# Patient Record
Sex: Female | Born: 1960
Health system: Southern US, Community
[De-identification: ages and names within clinical notes are randomized; demographics above are authoritative.]

## PROBLEM LIST (undated history)

## (undated) DIAGNOSIS — I10 Essential (primary) hypertension: Secondary | ICD-10-CM

## (undated) HISTORY — PX: TUBAL LIGATION: SHX77

## (undated) HISTORY — DX: Essential (primary) hypertension: I10

---

## 1998-09-29 ENCOUNTER — Emergency Department (HOSPITAL_COMMUNITY): Admission: EM | Admit: 1998-09-29 | Discharge: 1998-09-29 | Payer: Self-pay | Admitting: Emergency Medicine

## 1998-09-29 ENCOUNTER — Encounter: Payer: Self-pay | Admitting: Emergency Medicine

## 2001-03-26 ENCOUNTER — Emergency Department (HOSPITAL_COMMUNITY): Admission: EM | Admit: 2001-03-26 | Discharge: 2001-03-26 | Payer: Self-pay | Admitting: Emergency Medicine

## 2001-03-26 ENCOUNTER — Encounter: Payer: Self-pay | Admitting: Emergency Medicine

## 2002-05-19 ENCOUNTER — Emergency Department (HOSPITAL_COMMUNITY): Admission: EM | Admit: 2002-05-19 | Discharge: 2002-05-20 | Payer: Self-pay | Admitting: Emergency Medicine

## 2011-03-07 ENCOUNTER — Emergency Department (HOSPITAL_COMMUNITY): Payer: BC Managed Care – PPO

## 2011-03-07 ENCOUNTER — Emergency Department (HOSPITAL_COMMUNITY)
Admission: EM | Admit: 2011-03-07 | Discharge: 2011-03-07 | Disposition: A | Discharge status: Home / Self Care | Payer: BC Managed Care – PPO | Attending: Emergency Medicine | Admitting: Emergency Medicine

## 2011-03-07 DIAGNOSIS — R110 Nausea: Secondary | ICD-10-CM | POA: Insufficient documentation

## 2011-03-07 DIAGNOSIS — H538 Other visual disturbances: Secondary | ICD-10-CM | POA: Insufficient documentation

## 2011-03-07 DIAGNOSIS — R51 Headache: Secondary | ICD-10-CM | POA: Insufficient documentation

## 2011-03-07 DIAGNOSIS — R141 Gas pain: Secondary | ICD-10-CM | POA: Insufficient documentation

## 2011-03-07 DIAGNOSIS — R109 Unspecified abdominal pain: Secondary | ICD-10-CM | POA: Insufficient documentation

## 2011-03-07 DIAGNOSIS — K8020 Calculus of gallbladder without cholecystitis without obstruction: Secondary | ICD-10-CM | POA: Insufficient documentation

## 2011-03-07 DIAGNOSIS — R42 Dizziness and giddiness: Secondary | ICD-10-CM | POA: Insufficient documentation

## 2011-03-07 DIAGNOSIS — R142 Eructation: Secondary | ICD-10-CM | POA: Insufficient documentation

## 2011-03-07 LAB — URINALYSIS, ROUTINE W REFLEX MICROSCOPIC
Bilirubin Urine: NEGATIVE
Glucose, UA: NEGATIVE mg/dL
Hgb urine dipstick: NEGATIVE
Ketones, ur: NEGATIVE mg/dL
Leukocytes, UA: NEGATIVE
Nitrite: POSITIVE — AB
Protein, ur: NEGATIVE mg/dL
Specific Gravity, Urine: 1.017 (ref 1.005–1.030)
Urobilinogen, UA: 0.2 mg/dL (ref 0.0–1.0)
pH: 7 (ref 5.0–8.0)

## 2011-03-07 LAB — COMPREHENSIVE METABOLIC PANEL
ALT: 16 U/L (ref 0–35)
AST: 23 U/L (ref 0–37)
Albumin: 3.5 g/dL (ref 3.5–5.2)
Alkaline Phosphatase: 97 U/L (ref 39–117)
BUN: 10 mg/dL (ref 6–23)
CO2: 23 mEq/L (ref 19–32)
Calcium: 9.2 mg/dL (ref 8.4–10.5)
Chloride: 108 mEq/L (ref 96–112)
Creatinine, Ser: 0.82 mg/dL (ref 0.4–1.2)
GFR calc Af Amer: >60 mL/min (ref >60)
GFR calc non Af Amer: >60 mL/min (ref >60)
Glucose, Bld: 83 mg/dL (ref 70–99)
Potassium: 3.9 mEq/L (ref 3.5–5.1)
Sodium: 139 mEq/L (ref 135–145)
Total Bilirubin: 0.4 mg/dL (ref 0.3–1.2)
Total Protein: 6.8 g/dL (ref 6.0–8.3)

## 2011-03-07 LAB — DIFFERENTIAL
Basophils Absolute: 0 10*3/uL (ref 0.0–0.1)
Basophils Relative: 0 % (ref 0–1)
Eosinophils Absolute: 0.1 10*3/uL (ref 0.0–0.7)
Eosinophils Relative: 1 % (ref 0–5)
Lymphocytes Relative: 45 % (ref 12–46)
Lymphs Abs: 1.9 10*3/uL (ref 0.7–4.0)
Monocytes Absolute: 0.3 10*3/uL (ref 0.1–1.0)
Monocytes Relative: 8 % (ref 3–12)
Neutro Abs: 1.9 10*3/uL (ref 1.7–7.7)
Neutrophils Relative %: 46 % (ref 43–77)

## 2011-03-07 LAB — URINE MICROSCOPIC-ADD ON

## 2011-03-07 LAB — CBC
HCT: 37.9 % (ref 36.0–46.0)
Hemoglobin: 13 g/dL (ref 12.0–15.0)
MCH: 29.2 pg (ref 26.0–34.0)
MCHC: 34.3 g/dL (ref 30.0–36.0)
MCV: 85.2 fL (ref 78.0–100.0)
Platelets: 230 10*3/uL (ref 150–400)
RBC: 4.45 MIL/uL (ref 3.87–5.11)
RDW: 14.2 % (ref 11.5–15.5)
WBC: 4.2 10*3/uL (ref 4.0–10.5)

## 2011-03-07 LAB — LIPASE, BLOOD: Lipase: 23 U/L (ref 11–59)

## 2011-03-09 LAB — URINE CULTURE
Colony Count: >=100000
Culture  Setup Time: 201204241517

## 2011-04-03 ENCOUNTER — Encounter: Payer: Self-pay | Admitting: Internal Medicine

## 2011-04-06 ENCOUNTER — Ambulatory Visit: Payer: BC Managed Care – PPO | Admitting: Internal Medicine

## 2011-04-25 ENCOUNTER — Ambulatory Visit: Payer: BC Managed Care – PPO | Admitting: Internal Medicine

## 2011-04-25 DIAGNOSIS — Z0289 Encounter for other administrative examinations: Secondary | ICD-10-CM

## 2014-03-13 ENCOUNTER — Institutional Professional Consult (permissible substitution): Payer: BC Managed Care – PPO | Admitting: Internal Medicine

## 2014-10-17 ENCOUNTER — Emergency Department (HOSPITAL_COMMUNITY)
Admission: EM | Admit: 2014-10-17 | Discharge: 2014-10-17 | Disposition: A | Discharge status: Home / Self Care | Payer: BC Managed Care – PPO | Attending: Emergency Medicine | Admitting: Emergency Medicine

## 2014-10-17 ENCOUNTER — Encounter (HOSPITAL_COMMUNITY): Payer: Self-pay | Admitting: *Deleted

## 2014-10-17 DIAGNOSIS — Z79899 Other long term (current) drug therapy: Secondary | ICD-10-CM | POA: Insufficient documentation

## 2014-10-17 DIAGNOSIS — N6452 Nipple discharge: Secondary | ICD-10-CM | POA: Insufficient documentation

## 2014-10-17 DIAGNOSIS — Z3202 Encounter for pregnancy test, result negative: Secondary | ICD-10-CM | POA: Diagnosis not present

## 2014-10-17 LAB — URINALYSIS, ROUTINE W REFLEX MICROSCOPIC
Bilirubin Urine: NEGATIVE
Glucose, UA: NEGATIVE mg/dL
Hgb urine dipstick: NEGATIVE
Ketones, ur: NEGATIVE mg/dL
Leukocytes, UA: NEGATIVE
Nitrite: NEGATIVE
Protein, ur: NEGATIVE mg/dL
Specific Gravity, Urine: 1.018 (ref 1.005–1.030)
Urobilinogen, UA: 0.2 mg/dL (ref 0.0–1.0)
pH: 5.5 (ref 5.0–8.0)

## 2014-10-17 LAB — PREGNANCY, URINE: Preg Test, Ur: NEGATIVE

## 2014-10-17 NOTE — ED Provider Notes (Signed)
CSN: [id number]     Arrival date & time 10/17/14  1952 History   First MD Initiated Contact with Patient 10/17/14 2027     Chief Complaint  Patient presents with  . Breast Discharge     (Consider location/radiation/quality/duration/timing/severity/associated sxs/prior Treatment) Patient is a 53 y.o. female presenting with general illness.  Illness Associated symptoms: no cough, no fever, no rash and no shortness of breath    53 year old female with 1 day of bilateral nipple discharge. Patient states she has not had a normal period in over a year has had some spotting during that time. She's never had nipple discharge. Earlier today she started having milky white discharge from both of her breasts. This got to the point where she had soaked through multiple shirt so she came here for further evaluation. She denies any fever or redness pain or other symptoms at this time. She has no noticeable swollen lymph nodes in her armpits.  History reviewed. No pertinent past medical history. History reviewed. No pertinent past surgical history. No family history on file. History  Substance Use Topics  . Smoking status: Never Smoker   . Smokeless tobacco: Not on file  . Alcohol Use: Yes   OB History    No data available     Review of Systems  Constitutional: Negative for fever and activity change.  Eyes: Negative for photophobia and pain.  Respiratory: Negative for cough and shortness of breath.   Cardiovascular:       Breast discharge  Endocrine: Negative for polydipsia and polyuria.  Skin: Negative for rash.  All other systems reviewed and are negative.     Allergies  Review of patient's allergies indicates no known allergies.  Home Medications   Prior to Admission medications   Medication Sig Start Date End Date Taking? Authorizing Provider  Ascorbic Acid (CHEW-C PO) Take 1 tablet by mouth daily as needed (for cold).   Yes Historical Provider, MD   BP 160/79 mmHg  Pulse 67   Temp(Src) 98.3 F (36.8 C)  Resp 16  Ht 5\' 3"  (1.6 m)  Wt 200 lb (90.719 kg)  BMI 35.44 kg/m2  SpO2 100% Physical Exam  Constitutional: She is oriented to person, place, and time. She appears well-developed and well-nourished.  HENT:  Head: Normocephalic and atraumatic.  Eyes: Conjunctivae and EOM are normal. Right eye exhibits no discharge. Left eye exhibits no discharge.  Cardiovascular: Normal rate and regular rhythm.   Pulmonary/Chest: Effort normal and breath sounds normal. No respiratory distress.  Bilateral breast with minimal clear, yellow oily discharge. No palpable masses, ttp, erythema or warmth  Abdominal: Soft. She exhibits no distension. There is no tenderness. There is no rebound.  Musculoskeletal: Normal range of motion. She exhibits no edema or tenderness.  Neurological: She is alert and oriented to person, place, and time.  Skin: Skin is warm and dry.  Nursing note and vitals reviewed.   ED Course  Procedures (including critical care time) Labs Review Labs Reviewed  URINALYSIS, ROUTINE W REFLEX MICROSCOPIC  PREGNANCY, URINE    Imaging Review No results found.   EKG Interpretation None      MDM   Final diagnoses:  Nipple discharge    53 year old female who is currently going through menopause presents to the emergency department for bilateral nipple discharge started today. Copious amounts per the patient. Exam with a yellowish clear discharge from bilateral nipples no palpable masses no erythema pain or lymphadenopathy. Discharge is likely physiologic review of systems otherwise  review of systems is negative no need to workup further emergency Department will follow-up with gynecology.    Merrily Pew, MD 10/17/14 2115  Pamella Pert, MD 10/18/14 [phone]

## 2014-10-17 NOTE — ED Notes (Signed)
Tubal ligation years ago

## 2014-10-17 NOTE — ED Notes (Signed)
Bi-lateral breast drainage for 1-2 hours.  Clear like water but appears like an oil copious amounts of leakage.  Milky earlier .  She can run her fingers across her nipples and it leaks.  She has had  Bi-lateral leg swelling also from her knees down that started yesterday but it is extreme now in both legs

## 2015-02-04 ENCOUNTER — Other Ambulatory Visit: Payer: Self-pay | Admitting: Obstetrics & Gynecology

## 2015-02-04 DIAGNOSIS — N644 Mastodynia: Secondary | ICD-10-CM

## 2015-02-10 ENCOUNTER — Ambulatory Visit
Admission: RE | Admit: 2015-02-10 | Discharge: 2015-02-10 | Disposition: A | Discharge status: Home / Self Care | Payer: BLUE CROSS/BLUE SHIELD | Source: Ambulatory Visit | Attending: Obstetrics & Gynecology | Admitting: Obstetrics & Gynecology

## 2015-02-10 DIAGNOSIS — N644 Mastodynia: Secondary | ICD-10-CM

## 2016-02-28 ENCOUNTER — Telehealth: Payer: Self-pay | Admitting: Internal Medicine

## 2016-02-28 NOTE — Telephone Encounter (Signed)
Patient is requesting Dr. Ronnald Ramp to take on as patient.  Please advise.

## 2016-02-28 NOTE — Telephone Encounter (Signed)
Got patient scheduled

## 2016-02-28 NOTE — Telephone Encounter (Signed)
ok 

## 2016-03-06 ENCOUNTER — Ambulatory Visit (INDEPENDENT_AMBULATORY_CARE_PROVIDER_SITE_OTHER): Payer: BLUE CROSS/BLUE SHIELD | Admitting: Internal Medicine

## 2016-03-06 ENCOUNTER — Encounter: Payer: Self-pay | Admitting: Internal Medicine

## 2016-03-06 ENCOUNTER — Other Ambulatory Visit (INDEPENDENT_AMBULATORY_CARE_PROVIDER_SITE_OTHER): Payer: BLUE CROSS/BLUE SHIELD

## 2016-03-06 VITALS — BP 162/108 | HR 57 | Temp 98.0°F | Resp 16 | Ht 63.0 in | Wt 242.0 lb

## 2016-03-06 DIAGNOSIS — R0683 Snoring: Secondary | ICD-10-CM

## 2016-03-06 DIAGNOSIS — Z0000 Encounter for general adult medical examination without abnormal findings: Secondary | ICD-10-CM

## 2016-03-06 DIAGNOSIS — I10 Essential (primary) hypertension: Secondary | ICD-10-CM

## 2016-03-06 LAB — HEMOGLOBIN A1C: Hgb A1c MFr Bld: 5.8 % (ref 4.6–6.5)

## 2016-03-06 LAB — LIPID PANEL
Cholesterol: 215 mg/dL — ABNORMAL HIGH (ref 0–200)
HDL: 80.9 mg/dL (ref >39.00)
LDL Cholesterol: 121 mg/dL — ABNORMAL HIGH (ref 0–99)
NonHDL: 134.03
Total CHOL/HDL Ratio: 3
Triglycerides: 66 mg/dL (ref 0.0–149.0)
VLDL: 13.2 mg/dL (ref 0.0–40.0)

## 2016-03-06 LAB — CBC WITH DIFFERENTIAL/PLATELET
Basophils Absolute: 0 10*3/uL (ref 0.0–0.1)
Basophils Relative: 0.4 % (ref 0.0–3.0)
Eosinophils Absolute: 0 10*3/uL (ref 0.0–0.7)
Eosinophils Relative: 0.6 % (ref 0.0–5.0)
HCT: 40.4 % (ref 36.0–46.0)
Hemoglobin: 13.7 g/dL (ref 12.0–15.0)
Lymphocytes Relative: 44.9 % (ref 12.0–46.0)
Lymphs Abs: 1.7 10*3/uL (ref 0.7–4.0)
MCHC: 34 g/dL (ref 30.0–36.0)
MCV: 86.4 fl (ref 78.0–100.0)
Monocytes Absolute: 0.3 10*3/uL (ref 0.1–1.0)
Monocytes Relative: 7.4 % (ref 3.0–12.0)
Neutro Abs: 1.7 10*3/uL (ref 1.4–7.7)
Neutrophils Relative %: 46.7 % (ref 43.0–77.0)
Platelets: 246 10*3/uL (ref 150.0–400.0)
RBC: 4.68 Mil/uL (ref 3.87–5.11)
RDW: 14.2 % (ref 11.5–15.5)
WBC: 3.7 10*3/uL — ABNORMAL LOW (ref 4.0–10.5)

## 2016-03-06 LAB — TSH: TSH: 2.23 u[IU]/mL (ref 0.35–4.50)

## 2016-03-06 LAB — URINALYSIS, ROUTINE W REFLEX MICROSCOPIC
Bilirubin Urine: NEGATIVE
Ketones, ur: NEGATIVE
Leukocytes, UA: NEGATIVE
Nitrite: NEGATIVE
Specific Gravity, Urine: 1.01 (ref 1.000–1.030)
Total Protein, Urine: NEGATIVE
Urine Glucose: NEGATIVE
Urobilinogen, UA: 0.2 (ref 0.0–1.0)
pH: 6 (ref 5.0–8.0)

## 2016-03-06 LAB — COMPREHENSIVE METABOLIC PANEL
ALT: 12 U/L (ref 0–35)
AST: 16 U/L (ref 0–37)
Albumin: 4.2 g/dL (ref 3.5–5.2)
Alkaline Phosphatase: 95 U/L (ref 39–117)
BUN: 10 mg/dL (ref 6–23)
CO2: 27 mEq/L (ref 19–32)
Calcium: 9.8 mg/dL (ref 8.4–10.5)
Chloride: 104 mEq/L (ref 96–112)
Creatinine, Ser: 0.94 mg/dL (ref 0.40–1.20)
GFR: 79.94 mL/min (ref >60.00)
Glucose, Bld: 97 mg/dL (ref 70–99)
Potassium: 3.8 mEq/L (ref 3.5–5.1)
Sodium: 140 mEq/L (ref 135–145)
Total Bilirubin: 0.5 mg/dL (ref 0.2–1.2)
Total Protein: 7.4 g/dL (ref 6.0–8.3)

## 2016-03-06 LAB — HIV ANTIBODY (ROUTINE TESTING W REFLEX): HIV 1&2 Ab, 4th Generation: NONREACTIVE

## 2016-03-06 LAB — HEPATITIS C ANTIBODY: HCV Ab: NEGATIVE

## 2016-03-06 MED ORDER — HYDROCHLOROTHIAZIDE 25 MG PO TABS: 25.0000 mg | ORAL_TABLET | Freq: Every day | ORAL | Status: DC

## 2016-03-06 NOTE — Patient Instructions (Signed)
Hypertension Hypertension, commonly called high blood pressure, is when the force of blood pumping through your arteries is too strong. Your arteries are the blood vessels that carry blood from your heart throughout your body. A blood pressure reading consists of a higher number over a lower number, such as 110/72. The higher number (systolic) is the pressure inside your arteries when your heart pumps. The lower number (diastolic) is the pressure inside your arteries when your heart relaxes. Ideally you want your blood pressure below 120/80. Hypertension forces your heart to work harder to pump blood. Your arteries may become narrow or stiff. Having untreated or uncontrolled hypertension can cause heart attack, stroke, kidney disease, and other problems. RISK FACTORS Some risk factors for high blood pressure are controllable. Others are not.  Risk factors you cannot control include:   Race. You may be at higher risk if you are African American.  Age. Risk increases with age.  Gender. Men are at higher risk than women before age 45 years. After age 65, women are at higher risk than men. Risk factors you can control include:  Not getting enough exercise or physical activity.  Being overweight.  Getting too much fat, sugar, calories, or salt in your diet.  Drinking too much alcohol. SIGNS AND SYMPTOMS Hypertension does not usually cause signs or symptoms. Extremely high blood pressure (hypertensive crisis) may cause headache, anxiety, shortness of breath, and nosebleed. DIAGNOSIS To check if you have hypertension, your health care provider will measure your blood pressure while you are seated, with your arm held at the level of your heart. It should be measured at least twice using the same arm. Certain conditions can cause a difference in blood pressure between your right and left arms. A blood pressure reading that is higher than normal on one occasion does not mean that you need treatment. If  it is not clear whether you have high blood pressure, you may be asked to return on a different day to have your blood pressure checked again. Or, you may be asked to monitor your blood pressure at home for 1 or more weeks. TREATMENT Treating high blood pressure includes making lifestyle changes and possibly taking medicine. Living a healthy lifestyle can help lower high blood pressure. You may need to change some of your habits. Lifestyle changes may include:  Following the DASH diet. This diet is high in fruits, vegetables, and whole grains. It is low in salt, red meat, and added sugars.  Keep your sodium intake below 2,300 mg per day.  Getting at least 30-45 minutes of aerobic exercise at least 4 times per week.  Losing weight if necessary.  Not smoking.  Limiting alcoholic beverages.  Learning ways to reduce stress. Your health care provider may prescribe medicine if lifestyle changes are not enough to get your blood pressure under control, and if one of the following is true:  You are 18-59 years of age and your systolic blood pressure is above 140.  You are 60 years of age or older, and your systolic blood pressure is above 150.  Your diastolic blood pressure is above 90.  You have diabetes, and your systolic blood pressure is over 140 or your diastolic blood pressure is over 90.  You have kidney disease and your blood pressure is above 140/90.  You have heart disease and your blood pressure is above 140/90. Your personal target blood pressure may vary depending on your medical conditions, your age, and other factors. HOME CARE INSTRUCTIONS    Have your blood pressure rechecked as directed by your health care provider.   Take medicines only as directed by your health care provider. Follow the directions carefully. Blood pressure medicines must be taken as prescribed. The medicine does not work as well when you skip doses. Skipping doses also puts you at risk for  problems.  Do not smoke.   Monitor your blood pressure at home as directed by your health care provider. SEEK MEDICAL CARE IF:   You think you are having a reaction to medicines taken.  You have recurrent headaches or feel dizzy.  You have swelling in your ankles.  You have trouble with your vision. SEEK IMMEDIATE MEDICAL CARE IF:  You develop a severe headache or confusion.  You have unusual weakness, numbness, or feel faint.  You have severe chest or abdominal pain.  You vomit repeatedly.  You have trouble breathing. MAKE SURE YOU:   Understand these instructions.  Will watch your condition.  Will get help right away if you are not doing well or get worse.   This information is not intended to replace advice given to you by your health care provider. Make sure you discuss any questions you have with your health care provider.   Document Released: 10/30/2005 Document Revised: 03/16/2015 Document Reviewed: 08/22/2013 Elsevier Interactive Patient Education 2016 [employer]  

## 2016-03-06 NOTE — Progress Notes (Signed)
Subjective:  Patient ID: Michelle Barker, female    DOB: August 12, 1961  Age: 55 y.o. MRN: [id number]  CC: Hypertension and Annual Exam   HPI [name] presents for a CPX and concerns about high blood pressure.  She has had hypertension for several years but it is never been severe enough for her to require treatment. She complains for the last few months that she has had a few episodes of headache, complains of mild swelling in both legs, and has had an elevated blood pressure when testing at home. She denies blurred vision, chest pain, shortness of breath, palpitations, hematuria, or dyspnea on exertion. She does complain of heavy snoring and thinks she may have sleep apnea.  History [name] has a past medical history of Hypertension.   She has no past surgical history on file.   Her family history includes Cancer (age of onset: 80) in her father; Diabetes in her brother, brother, brother, and brother; Early death (age of onset: 59) in her sister; Hypertension in her brother, brother, brother, brother, and mother. There is no history of Vision loss, Heart disease, Hyperlipidemia, or Alcohol abuse.She reports that she has never smoked. She has never used smokeless tobacco. She reports that she drinks about 0.6 oz of alcohol per week. She reports that she does not use illicit drugs.  Outpatient Prescriptions Prior to Visit  Medication Sig Dispense Refill  . Ascorbic Acid (CHEW-C PO) Take 1 tablet by mouth daily as needed (for cold).     No facility-administered medications prior to visit.    ROS Review of Systems  Constitutional: Negative.   Eyes: Negative.  Negative for visual disturbance.  Respiratory: Positive for apnea. Negative for cough, choking, chest tightness, shortness of breath, wheezing and stridor.   Cardiovascular: Positive for leg swelling. Negative for chest pain and palpitations.  Gastrointestinal: Negative.  Negative for nausea, abdominal pain, diarrhea and  constipation.  Endocrine: Negative.   Genitourinary: Negative.  Negative for dysuria, urgency, frequency, hematuria, flank pain, decreased urine volume and difficulty urinating.  Musculoskeletal: Negative.  Negative for myalgias, back pain, arthralgias and neck pain.  Skin: Negative.   Allergic/Immunologic: Negative.   Neurological: Positive for headaches. Negative for dizziness, seizures, speech difficulty, weakness, light-headedness and numbness.  Hematological: Negative.  Negative for adenopathy. Does not bruise/bleed easily.  Psychiatric/Behavioral: Negative.     Objective:  BP 162/108 mmHg  Pulse 57  Temp(Src) 98 F (36.7 C) (Oral)  Resp 16  Ht 5\' 3"  (1.6 m)  Wt 242 lb (109.77 kg)  BMI 42.88 kg/m2  SpO2 99%  Physical Exam  Constitutional: She is oriented to person, place, and time. No distress.  HENT:  Mouth/Throat: Oropharynx is clear and moist. No oropharyngeal exudate.  Eyes: Conjunctivae are normal. Right eye exhibits no discharge. Left eye exhibits no discharge. No scleral icterus.  Neck: Normal range of motion. Neck supple. No JVD present. No tracheal deviation present. No thyromegaly present.  Cardiovascular: Normal rate, regular rhythm, normal heart sounds and intact distal pulses.  Exam reveals no gallop and no friction rub.   No murmur heard. EKG --- Sinus  Bradycardia  -  Nonspecific T-abnormality.   ABNORMAL    Pulmonary/Chest: Effort normal and breath sounds normal. No stridor. No respiratory distress. She has no wheezes. She has no rales. She exhibits no tenderness.  Abdominal: Soft. Bowel sounds are normal. She exhibits no distension and no mass. There is no tenderness. There is no rebound and no guarding.  Musculoskeletal: Normal range of  motion. She exhibits edema (trace pitting edema bilateral lower extremities). She exhibits no tenderness.  Lymphadenopathy:    She has no cervical adenopathy.  Neurological: She is alert and oriented to person, place,  and time. She has normal reflexes. She displays normal reflexes. No cranial nerve deficit. She exhibits normal muscle tone. Coordination normal.  Skin: Skin is warm and dry. No rash noted. She is not diaphoretic. No erythema. No pallor.  Vitals reviewed.   Lab Results  Component Value Date   WBC 3.7* 03/06/2016   HGB 13.7 03/06/2016   HCT 40.4 03/06/2016   PLT 246.0 03/06/2016   GLUCOSE 97 03/06/2016   CHOL 215* 03/06/2016   TRIG 66.0 03/06/2016   HDL 80.90 03/06/2016   LDLCALC 121* 03/06/2016   ALT 12 03/06/2016   AST 16 03/06/2016   NA 140 03/06/2016   K 3.8 03/06/2016   CL 104 03/06/2016   CREATININE 0.94 03/06/2016   BUN 10 03/06/2016   CO2 27 03/06/2016   TSH 2.23 03/06/2016   HGBA1C 5.8 03/06/2016    Assessment & Plan:   Ben was seen today for hypertension and annual exam.  Diagnoses and all orders for this visit:  Routine general medical examination at a health care facility- exam done, labs ordered and reviewed, her mammogram and Pap smear up-to-date, She does not want to do a colonoscopy so I ordered a cologuard, vaccines were reviewed and updated, patient education material was given. -     Lipid panel; Future -     Comprehensive metabolic panel; Future -     CBC with Differential/Platelet; Future -     TSH; Future -     Urinalysis, Routine w reflex microscopic (not at [facility]); Future -     Hemoglobin A1c; Future  Snoring- she has many features of having sleep apnea, I've asked her to see sleep medicine for further evaluation. -     Ambulatory referral to Pulmonology  Essential hypertension- her EKG is negative for LVH, her labs are negative for any intercurrent damage or evidence of secondary causes, I've asked her to be evaluated for sleep apnea, will start treating with hydrochlorothiazide. -     EKG 12-Lead  I am having Ms. Kawai start on hydrochlorothiazide. I am also having her maintain her Ascorbic Acid (CHEW-C PO).  Meds ordered this encounter    Medications  . hydrochlorothiazide (HYDRODIURIL) 25 MG tablet    Sig: Take 1 tablet (25 mg total) by mouth daily.    Dispense:  30 tablet    Refill:  3     Follow-up: Return in about 6 weeks (around 04/17/2016).  Scarlette Calico, MD

## 2016-03-06 NOTE — Progress Notes (Signed)
Pre visit review using our clinic review tool, if applicable. No additional management support is needed unless otherwise documented below in the visit note. 

## 2016-03-07 ENCOUNTER — Encounter: Payer: Self-pay | Admitting: Internal Medicine

## 2016-03-10 ENCOUNTER — Telehealth: Payer: Self-pay

## 2016-03-10 NOTE — Telephone Encounter (Signed)
Faxed cologuard form 

## 2016-03-28 DIAGNOSIS — H6691 Otitis media, unspecified, right ear: Secondary | ICD-10-CM | POA: Diagnosis not present

## 2016-03-28 DIAGNOSIS — J0120 Acute ethmoidal sinusitis, unspecified: Secondary | ICD-10-CM | POA: Diagnosis not present

## 2016-05-25 ENCOUNTER — Telehealth: Payer: Self-pay

## 2016-05-25 NOTE — Telephone Encounter (Signed)
Received a fax from [employer] stating that order has been suspended due to inactivity.   LVM for pt to call back as soon as possible.   [id number] pt still want the cologuard?

## 2016-07-07 ENCOUNTER — Other Ambulatory Visit: Payer: Self-pay | Admitting: Internal Medicine

## 2016-07-07 DIAGNOSIS — I10 Essential (primary) hypertension: Secondary | ICD-10-CM

## 2017-01-19 ENCOUNTER — Other Ambulatory Visit: Payer: Self-pay | Admitting: Internal Medicine

## 2017-01-19 DIAGNOSIS — I10 Essential (primary) hypertension: Secondary | ICD-10-CM

## 2017-02-13 ENCOUNTER — Ambulatory Visit (INDEPENDENT_AMBULATORY_CARE_PROVIDER_SITE_OTHER): Payer: BLUE CROSS/BLUE SHIELD | Admitting: Internal Medicine

## 2017-02-13 ENCOUNTER — Other Ambulatory Visit (INDEPENDENT_AMBULATORY_CARE_PROVIDER_SITE_OTHER): Payer: BLUE CROSS/BLUE SHIELD

## 2017-02-13 VITALS — BP 148/100 | HR 60 | Temp 98.2°F | Resp 16 | Ht 63.0 in | Wt 233.1 lb

## 2017-02-13 DIAGNOSIS — R0683 Snoring: Secondary | ICD-10-CM

## 2017-02-13 DIAGNOSIS — I10 Essential (primary) hypertension: Secondary | ICD-10-CM | POA: Diagnosis not present

## 2017-02-13 LAB — BASIC METABOLIC PANEL
BUN: 10 mg/dL (ref 6–23)
CO2: 29 mEq/L (ref 19–32)
Calcium: 9.5 mg/dL (ref 8.4–10.5)
Chloride: 104 mEq/L (ref 96–112)
Creatinine, Ser: 0.84 mg/dL (ref 0.40–1.20)
GFR: 90.7 mL/min (ref >60.00)
Glucose, Bld: 90 mg/dL (ref 70–99)
Potassium: 4 mEq/L (ref 3.5–5.1)
Sodium: 139 mEq/L (ref 135–145)

## 2017-02-13 LAB — CBC WITH DIFFERENTIAL/PLATELET
Basophils Absolute: 0 10*3/uL (ref 0.0–0.1)
Basophils Relative: 1 % (ref 0.0–3.0)
Eosinophils Absolute: 0.1 10*3/uL (ref 0.0–0.7)
Eosinophils Relative: 1.5 % (ref 0.0–5.0)
HCT: 41.4 % (ref 36.0–46.0)
Hemoglobin: 13.9 g/dL (ref 12.0–15.0)
Lymphocytes Relative: 50.6 % — ABNORMAL HIGH (ref 12.0–46.0)
Lymphs Abs: 2.3 10*3/uL (ref 0.7–4.0)
MCHC: 33.7 g/dL (ref 30.0–36.0)
MCV: 87.6 fl (ref 78.0–100.0)
Monocytes Absolute: 0.4 10*3/uL (ref 0.1–1.0)
Monocytes Relative: 7.7 % (ref 3.0–12.0)
Neutro Abs: 1.8 10*3/uL (ref 1.4–7.7)
Neutrophils Relative %: 39.2 % — ABNORMAL LOW (ref 43.0–77.0)
Platelets: 280 10*3/uL (ref 150.0–400.0)
RBC: 4.72 Mil/uL (ref 3.87–5.11)
RDW: 15 % (ref 11.5–15.5)
WBC: 4.6 10*3/uL (ref 4.0–10.5)

## 2017-02-13 MED ORDER — CHLORTHALIDONE 25 MG PO TABS: 25.0000 mg | ORAL_TABLET | Freq: Every day | ORAL | 1 refills | Status: DC

## 2017-02-13 NOTE — Progress Notes (Signed)
Subjective:  Patient ID: Michelle Barker, female    DOB: [birth year]  Age: 56 y.o. MRN: [id number]  CC: Hypertension   HPI Laretta Pyatt presents for a BP check - She ran out of hydrochlorothiazide about 3 weeks ago and since then her blood pressure has not been well controlled. She complains of heavy snoring. She has intermittent lower extremity edema and complains that she can't lose weight. She denies any recent episodes of headache/blurred vision/chest pain/shortness of breath/palpitations/fatigue.  Outpatient Medications Prior to Visit  Medication Sig Dispense Refill  . Ascorbic Acid (CHEW-C PO) Take 1 tablet by mouth daily as needed (for cold).    . hydrochlorothiazide (HYDRODIURIL) 25 MG tablet TAKE 1 TABLET (25 MG TOTAL) BY MOUTH DAILY. 90 tablet 1   No facility-administered medications prior to visit.     ROS Review of Systems  Constitutional: Negative.  Negative for activity change, appetite change, diaphoresis, fatigue and unexpected weight change.  HENT: Negative.   Eyes: Negative for visual disturbance.  Respiratory: Negative for cough, chest tightness, shortness of breath and wheezing.   Cardiovascular: Negative for chest pain, palpitations and leg swelling.  Gastrointestinal: Negative for abdominal pain, constipation, diarrhea, nausea and vomiting.  Endocrine: Negative.   Genitourinary: Negative.  Negative for difficulty urinating.  Musculoskeletal: Negative.  Negative for back pain and myalgias.  Skin: Negative.   Allergic/Immunologic: Negative.   Neurological: Negative.   Hematological: Negative for adenopathy. Does not bruise/bleed easily.  Psychiatric/Behavioral: Negative.     Objective:  BP (!) 148/100 (BP Location: Left Arm, Patient Position: Sitting, Cuff Size: Large)   Pulse 60   Temp 98.2 F (36.8 C) (Oral)   Resp 16   Ht 5\' 3"  (1.6 m)   Wt 233 lb 1.3 oz (105.7 kg)   SpO2 98%   BMI 41.29 kg/m   BP Readings from Last 3 Encounters:  02/13/17 (!)  148/100  03/06/16 (!) 162/108  10/17/14 157/74    Wt Readings from Last 3 Encounters:  02/13/17 233 lb 1.3 oz (105.7 kg)  03/06/16 242 lb (109.8 kg)  10/17/14 200 lb (90.7 kg)    Physical Exam  Constitutional: She is oriented to person, place, and time. No distress.  HENT:  Mouth/Throat: Oropharynx is clear and moist. No oropharyngeal exudate.  Eyes: Conjunctivae are normal. Right eye exhibits no discharge. Left eye exhibits no discharge. No scleral icterus.  Neck: Normal range of motion. Neck supple. No JVD present. No tracheal deviation present. No thyromegaly present.  Cardiovascular: Normal rate, regular rhythm, normal heart sounds and intact distal pulses.  Exam reveals no gallop and no friction rub.   No murmur heard. Pulmonary/Chest: Effort normal and breath sounds normal. No stridor. No respiratory distress. She has no wheezes. She has no rales. She exhibits no tenderness.  Abdominal: Soft. Bowel sounds are normal. She exhibits no distension and no mass. There is no tenderness. There is no rebound and no guarding.  Musculoskeletal: She exhibits edema (trace LE edema). She exhibits no tenderness or deformity.  Lymphadenopathy:    She has no cervical adenopathy.  Neurological: She is oriented to person, place, and time.  Skin: Skin is warm and dry. No rash noted. She is not diaphoretic. No erythema.  Vitals reviewed.   Lab Results  Component Value Date   WBC 4.6 02/13/2017   HGB 13.9 02/13/2017   HCT 41.4 02/13/2017   PLT 280.0 02/13/2017   GLUCOSE 90 02/13/2017   CHOL 215 (H) 03/06/2016   TRIG 66.0 03/06/2016  HDL 80.90 03/06/2016   LDLCALC 121 (H) 03/06/2016   ALT 12 03/06/2016   AST 16 03/06/2016   NA 139 02/13/2017   K 4.0 02/13/2017   CL 104 02/13/2017   CREATININE 0.84 02/13/2017   BUN 10 02/13/2017   CO2 29 02/13/2017   TSH 2.23 03/06/2016   HGBA1C 5.8 03/06/2016    US Breast Ltd Uni Left Inc Axilla  Addendum Date: 02/24/2015   ADDENDUM REPORT:  02/24/2015 09:24 ADDENDUM: Outside prior mammograms dated 12/24/2013 were obtained and compared with current evaluation. After comparison with prior evaluation, current recommendation is unchanged. Recommendation is as follows: Six-month follow-up diagnostic mammography of the right and left breast to ensure stability of probably benign findings. Electronically Signed   By: Lovey Newcomer M.D.   On: 02/24/2015 09:24   Result Date: 02/10/2015 CLINICAL DATA:  Patient presents for evaluation of diffuse tenderness within the left breast. EXAM: DIGITAL DIAGNOSTIC BILATERAL MAMMOGRAM WITH 3D TOMOSYNTHESIS WITH CAD ULTRASOUND LEFT BREAST COMPARISON:  None. ACR Breast Density Category c: The breast tissue is heterogeneously dense, which may obscure small masses. FINDINGS: Magnification CC and true lateral views demonstrate loosely grouped round and punctate calcifications within the subareolar aspect of the right breast extending to the 12 o'clock position. Bilateral dystrophic calcifications are demonstrated. Additionally within the upper inner left breast posterior depth there is an 8 mm low-density oval circumscribed mass. Mammographic images were processed with CAD. On physical exam, I palpate no discrete mass within the upper inner left breast. Targeted ultrasound is performed, showing no suspicious mass within the upper inner left breast posterior depth. IMPRESSION: Patient has prior outside mammograms which have not been obtained prior to today's diagnostic evaluation. An attempt will be made to obtain these mammograms. Probably benign right breast calcifications. Probably benign left breast mass. RECOMMENDATION: Attempt to obtain outside prior mammograms and compare with current evaluation. If these do not become available, six-month follow-up diagnostic mammography of the right and left breast is recommended to ensure stability of probably benign findings. I have discussed the findings and recommendations with the  patient. Results were also provided in writing at the conclusion of the visit. If applicable, a reminder letter will be sent to the patient regarding the next appointment. BI-RADS CATEGORY  0: Incomplete. Need additional imaging evaluation and/or prior mammograms for comparison. Electronically Signed: By: Lovey Newcomer M.D. On: 02/10/2015 13:29   Mm Diag Breast Tomo Bilateral  Addendum Date: 02/24/2015   ADDENDUM REPORT: 02/24/2015 09:24 ADDENDUM: Outside prior mammograms dated 12/24/2013 were obtained and compared with current evaluation. After comparison with prior evaluation, current recommendation is unchanged. Recommendation is as follows: Six-month follow-up diagnostic mammography of the right and left breast to ensure stability of probably benign findings. Electronically Signed   By: Lovey Newcomer M.D.   On: 02/24/2015 09:24   Result Date: 02/10/2015 CLINICAL DATA:  Patient presents for evaluation of diffuse tenderness within the left breast. EXAM: DIGITAL DIAGNOSTIC BILATERAL MAMMOGRAM WITH 3D TOMOSYNTHESIS WITH CAD ULTRASOUND LEFT BREAST COMPARISON:  None. ACR Breast Density Category c: The breast tissue is heterogeneously dense, which may obscure small masses. FINDINGS: Magnification CC and true lateral views demonstrate loosely grouped round and punctate calcifications within the subareolar aspect of the right breast extending to the 12 o'clock position. Bilateral dystrophic calcifications are demonstrated. Additionally within the upper inner left breast posterior depth there is an 8 mm low-density oval circumscribed mass. Mammographic images were processed with CAD. On physical exam, I palpate no discrete mass within  the upper inner left breast. Targeted ultrasound is performed, showing no suspicious mass within the upper inner left breast posterior depth. IMPRESSION: Patient has prior outside mammograms which have not been obtained prior to today's diagnostic evaluation. An attempt will be made to  obtain these mammograms. Probably benign right breast calcifications. Probably benign left breast mass. RECOMMENDATION: Attempt to obtain outside prior mammograms and compare with current evaluation. If these do not become available, six-month follow-up diagnostic mammography of the right and left breast is recommended to ensure stability of probably benign findings. I have discussed the findings and recommendations with the patient. Results were also provided in writing at the conclusion of the visit. If applicable, a reminder letter will be sent to the patient regarding the next appointment. BI-RADS CATEGORY  0: Incomplete. Need additional imaging evaluation and/or prior mammograms for comparison. Electronically Signed: By: Lovey Newcomer M.D. On: 02/10/2015 13:29    Assessment & Plan:   Malkia was seen today for hypertension.  Diagnoses and all orders for this visit:  Essential hypertension- Her blood pressure is not adequately well controlled, her labs are negative for any evidence of secondary causes or end organ damage, will restart a thiazide diuretic for blood pressure control. I am concerned that sleep apnea may be contributing to the blood pressure elevation so I have asked her to undergo a sleep evaluation. -     chlorthalidone (HYGROTON) 25 MG tablet; Take 1 tablet (25 mg total) by mouth daily. -     CBC with Differential/Platelet; Future -     Basic metabolic panel; Future  Snoring -     Ambulatory referral to Sleep Studies   I have discontinued Ms. Otero's hydrochlorothiazide. I am also having her start on chlorthalidone. Additionally, I am having her maintain her Ascorbic Acid (CHEW-C PO).  Meds ordered this encounter  Medications  . chlorthalidone (HYGROTON) 25 MG tablet    Sig: Take 1 tablet (25 mg total) by mouth daily.    Dispense:  90 tablet    Refill:  1     Follow-up: Return in about 6 weeks (around 03/27/2017).  Scarlette Calico, MD

## 2017-02-13 NOTE — Patient Instructions (Signed)

## 2017-02-13 NOTE — Progress Notes (Signed)
Pre visit review using our clinic review tool, if applicable. No additional management support is needed unless otherwise documented below in the visit note. 

## 2017-02-14 ENCOUNTER — Encounter: Payer: Self-pay | Admitting: Internal Medicine

## 2017-02-16 ENCOUNTER — Telehealth: Payer: Self-pay

## 2017-02-16 NOTE — Telephone Encounter (Signed)
Cologuard ordered   Order number: [id number]

## 2017-08-28 ENCOUNTER — Other Ambulatory Visit: Payer: Self-pay | Admitting: Internal Medicine

## 2017-08-28 ENCOUNTER — Telehealth: Payer: Self-pay | Admitting: Internal Medicine

## 2017-08-28 DIAGNOSIS — I10 Essential (primary) hypertension: Secondary | ICD-10-CM

## 2017-08-28 MED ORDER — CHLORTHALIDONE 25 MG PO TABS: 25.0000 mg | ORAL_TABLET | Freq: Every day | ORAL | 0 refills | Status: DC

## 2017-08-28 NOTE — Telephone Encounter (Signed)
Per office policy sent 30 day to local [facility] pharmacy until appt...Michelle Barker

## 2017-08-28 NOTE — Telephone Encounter (Signed)
Has scheduled an appt through my chart for the 25th.  Patient will run out of BP meds this week.  Is requesting script to be sent to her pharmacy to get her through until this date.

## 2017-09-06 ENCOUNTER — Ambulatory Visit: Payer: Self-pay | Admitting: Internal Medicine

## 2017-09-13 ENCOUNTER — Ambulatory Visit: Payer: BLUE CROSS/BLUE SHIELD | Admitting: Internal Medicine

## 2017-09-28 ENCOUNTER — Telehealth: Payer: Self-pay | Admitting: Internal Medicine

## 2017-09-28 DIAGNOSIS — I10 Essential (primary) hypertension: Secondary | ICD-10-CM

## 2017-09-28 NOTE — Telephone Encounter (Signed)
Can you ask pt to come if for an appt before refills can be sent.

## 2017-09-28 NOTE — Telephone Encounter (Signed)
Patient is requesting refills she has set up an appointment for 11/28. Can she have refills until then.

## 2017-09-28 NOTE — Telephone Encounter (Signed)
Lm letting pt know

## 2017-10-01 MED ORDER — CHLORTHALIDONE 25 MG PO TABS: 25.0000 mg | ORAL_TABLET | Freq: Every day | ORAL | 0 refills | Status: DC

## 2017-10-01 NOTE — Telephone Encounter (Signed)
erx sent

## 2017-10-01 NOTE — Telephone Encounter (Signed)
Patient has been informed.

## 2017-10-10 ENCOUNTER — Ambulatory Visit: Payer: BLUE CROSS/BLUE SHIELD | Admitting: Internal Medicine

## 2017-10-17 ENCOUNTER — Ambulatory Visit: Payer: BLUE CROSS/BLUE SHIELD | Admitting: Internal Medicine

## 2017-10-23 ENCOUNTER — Ambulatory Visit: Payer: BLUE CROSS/BLUE SHIELD | Admitting: Internal Medicine

## 2017-10-29 ENCOUNTER — Other Ambulatory Visit (INDEPENDENT_AMBULATORY_CARE_PROVIDER_SITE_OTHER): Payer: BLUE CROSS/BLUE SHIELD

## 2017-10-29 ENCOUNTER — Ambulatory Visit (INDEPENDENT_AMBULATORY_CARE_PROVIDER_SITE_OTHER): Payer: BLUE CROSS/BLUE SHIELD | Admitting: Internal Medicine

## 2017-10-29 ENCOUNTER — Encounter: Payer: Self-pay | Admitting: Internal Medicine

## 2017-10-29 VITALS — BP 144/90 | HR 75 | Temp 99.2°F | Resp 16 | Ht 63.0 in | Wt 221.2 lb

## 2017-10-29 DIAGNOSIS — Z0000 Encounter for general adult medical examination without abnormal findings: Secondary | ICD-10-CM

## 2017-10-29 DIAGNOSIS — R200 Anesthesia of skin: Secondary | ICD-10-CM | POA: Diagnosis not present

## 2017-10-29 DIAGNOSIS — T50905A Adverse effect of unspecified drugs, medicaments and biological substances, initial encounter: Secondary | ICD-10-CM | POA: Diagnosis not present

## 2017-10-29 DIAGNOSIS — R0683 Snoring: Secondary | ICD-10-CM | POA: Diagnosis not present

## 2017-10-29 DIAGNOSIS — Z1231 Encounter for screening mammogram for malignant neoplasm of breast: Secondary | ICD-10-CM | POA: Insufficient documentation

## 2017-10-29 DIAGNOSIS — R51 Headache: Secondary | ICD-10-CM

## 2017-10-29 DIAGNOSIS — R519 Headache, unspecified: Secondary | ICD-10-CM | POA: Insufficient documentation

## 2017-10-29 DIAGNOSIS — R202 Paresthesia of skin: Secondary | ICD-10-CM

## 2017-10-29 DIAGNOSIS — Z124 Encounter for screening for malignant neoplasm of cervix: Secondary | ICD-10-CM | POA: Insufficient documentation

## 2017-10-29 DIAGNOSIS — E876 Hypokalemia: Secondary | ICD-10-CM

## 2017-10-29 DIAGNOSIS — I10 Essential (primary) hypertension: Secondary | ICD-10-CM

## 2017-10-29 LAB — CBC WITH DIFFERENTIAL/PLATELET
Basophils Absolute: 0 10*3/uL (ref 0.0–0.1)
Basophils Relative: 0.5 % (ref 0.0–3.0)
Eosinophils Absolute: 0 10*3/uL (ref 0.0–0.7)
Eosinophils Relative: 1 % (ref 0.0–5.0)
HCT: 42.5 % (ref 36.0–46.0)
Hemoglobin: 14.1 g/dL (ref 12.0–15.0)
Lymphocytes Relative: 47.1 % — ABNORMAL HIGH (ref 12.0–46.0)
Lymphs Abs: 2.3 10*3/uL (ref 0.7–4.0)
MCHC: 33.1 g/dL (ref 30.0–36.0)
MCV: 88.6 fl (ref 78.0–100.0)
Monocytes Absolute: 0.5 10*3/uL (ref 0.1–1.0)
Monocytes Relative: 10 % (ref 3.0–12.0)
Neutro Abs: 2 10*3/uL (ref 1.4–7.7)
Neutrophils Relative %: 41.4 % — ABNORMAL LOW (ref 43.0–77.0)
Platelets: 275 10*3/uL (ref 150.0–400.0)
RBC: 4.8 Mil/uL (ref 3.87–5.11)
RDW: 14.1 % (ref 11.5–15.5)
WBC: 4.9 10*3/uL (ref 4.0–10.5)

## 2017-10-29 MED ORDER — CHLORTHALIDONE 25 MG PO TABS: 25.0000 mg | ORAL_TABLET | Freq: Every day | ORAL | 0 refills | Status: DC

## 2017-10-29 NOTE — Progress Notes (Signed)
Subjective:  Patient ID: Michelle Barker, female    DOB: 1961-08-11  Age: 56 y.o. MRN: [id number]  CC: Headache; Hypertension; and Annual Exam   HPI Michelle Barker presents for a CPX.  She complains of a one-month history of bilateral frontal headaches that she describes as an intermittent throbbing pounding sensation that is worsened at night and when she wakes up in the morning.  She has heavy snoring and tells me her family members complain about her snoring.  She also complains of a few episodes of right upper extremity tingling over the last few weeks.  She denies blurred vision, nausea, vomiting, slurred speech, or ataxia.  Outpatient Medications Prior to Visit  Medication Sig Dispense Refill  . Ascorbic Acid (CHEW-C PO) Take 1 tablet by mouth daily as needed (for cold).    . chlorthalidone (HYGROTON) 25 MG tablet Take 1 tablet (25 mg total) daily by mouth. Must keep appt for future refills 30 tablet 0   No facility-administered medications prior to visit.     ROS Review of Systems  Constitutional: Negative.  Negative for diaphoresis and fatigue.  HENT: Negative.  Negative for trouble swallowing.   Eyes: Negative for visual disturbance.  Respiratory: Negative.  Negative for cough, chest tightness and shortness of breath.   Cardiovascular: Negative.  Negative for chest pain, palpitations and leg swelling.  Gastrointestinal: Negative for abdominal pain, diarrhea, nausea and vomiting.  Endocrine: Negative.   Genitourinary: Negative.  Negative for difficulty urinating and dysuria.  Musculoskeletal: Negative.  Negative for back pain, myalgias and neck pain.  Skin: Negative.   Allergic/Immunologic: Negative.   Neurological: Positive for numbness and headaches. Negative for dizziness, tremors, syncope, facial asymmetry, speech difficulty, weakness and light-headedness.  Hematological: Negative for adenopathy. Does not bruise/bleed easily.  Psychiatric/Behavioral: Negative.      Objective:  BP (!) 144/90 (BP Location: Left Arm, Patient Position: Sitting, Cuff Size: Large)   Pulse 75   Temp 99.2 F (37.3 C) (Oral)   Resp 16   Ht 5\' 3"  (1.6 m)   Wt 221 lb 4 oz (100.4 kg)   SpO2 98%   BMI 39.19 kg/m   BP Readings from Last 3 Encounters:  10/29/17 (!) 144/90  02/13/17 (!) 148/100  03/06/16 (!) 162/108    Wt Readings from Last 3 Encounters:  10/29/17 221 lb 4 oz (100.4 kg)  02/13/17 233 lb 1.3 oz (105.7 kg)  03/06/16 242 lb (109.8 kg)    Physical Exam  Constitutional: She is oriented to person, place, and time. No distress.  HENT:  Mouth/Throat: Oropharynx is clear and moist. No oropharyngeal exudate.  Eyes: Conjunctivae and EOM are normal. Pupils are equal, round, and reactive to light. Right eye exhibits no discharge. Left eye exhibits no discharge. No scleral icterus.  Neck: Normal range of motion. Neck supple. No JVD present. No thyromegaly present.  Cardiovascular: Normal rate, regular rhythm and normal heart sounds.  No murmur heard. Pulmonary/Chest: Effort normal and breath sounds normal. No respiratory distress. She has no wheezes. She has no rales.  Abdominal: Soft. Bowel sounds are normal. She exhibits no distension and no mass. There is no tenderness.  Musculoskeletal: Normal range of motion. She exhibits no edema, tenderness or deformity.  Lymphadenopathy:    She has no cervical adenopathy.  Neurological: She is alert and oriented to person, place, and time. She has normal reflexes. She displays no atrophy and normal reflexes. No cranial nerve deficit or sensory deficit. She exhibits normal muscle tone. She displays  no seizure activity. Coordination and gait normal. She displays no Babinski's sign on the right side. She displays no Babinski's sign on the left side.  Skin: Skin is warm and dry. No rash noted. She is not diaphoretic. No erythema. No pallor.  Vitals reviewed.   Lab Results  Component Value Date   WBC 4.9 10/29/2017    HGB 14.1 10/29/2017   HCT 42.5 10/29/2017   PLT 275.0 10/29/2017   GLUCOSE 83 10/29/2017   CHOL 193 10/29/2017   TRIG 83.0 10/29/2017   HDL 71.70 10/29/2017   LDLCALC 104 (H) 10/29/2017   ALT 14 10/29/2017   AST 19 10/29/2017   NA 138 10/29/2017   K 3.0 (L) 10/29/2017   CL 95 (L) 10/29/2017   CREATININE 0.99 10/29/2017   BUN 16 10/29/2017   CO2 34 (H) 10/29/2017   TSH 2.07 10/29/2017   HGBA1C 5.8 03/06/2016    US Breast Ltd Uni Left Inc Axilla  Addendum Date: 02/24/2015   ADDENDUM REPORT: 02/24/2015 09:24 ADDENDUM: Outside prior mammograms dated 12/24/2013 were obtained and compared with current evaluation. After comparison with prior evaluation, current recommendation is unchanged. Recommendation is as follows: Six-month follow-up diagnostic mammography of the right and left breast to ensure stability of probably benign findings. Electronically Signed   By: Lovey Newcomer M.D.   On: 02/24/2015 09:24   Result Date: 02/10/2015 CLINICAL DATA:  Patient presents for evaluation of diffuse tenderness within the left breast. EXAM: DIGITAL DIAGNOSTIC BILATERAL MAMMOGRAM WITH 3D TOMOSYNTHESIS WITH CAD ULTRASOUND LEFT BREAST COMPARISON:  None. ACR Breast Density Category c: The breast tissue is heterogeneously dense, which may obscure small masses. FINDINGS: Magnification CC and true lateral views demonstrate loosely grouped round and punctate calcifications within the subareolar aspect of the right breast extending to the 12 o'clock position. Bilateral dystrophic calcifications are demonstrated. Additionally within the upper inner left breast posterior depth there is an 8 mm low-density oval circumscribed mass. Mammographic images were processed with CAD. On physical exam, I palpate no discrete mass within the upper inner left breast. Targeted ultrasound is performed, showing no suspicious mass within the upper inner left breast posterior depth. IMPRESSION: Patient has prior outside mammograms which have  not been obtained prior to today's diagnostic evaluation. An attempt will be made to obtain these mammograms. Probably benign right breast calcifications. Probably benign left breast mass. RECOMMENDATION: Attempt to obtain outside prior mammograms and compare with current evaluation. If these do not become available, six-month follow-up diagnostic mammography of the right and left breast is recommended to ensure stability of probably benign findings. I have discussed the findings and recommendations with the patient. Results were also provided in writing at the conclusion of the visit. If applicable, a reminder letter will be sent to the patient regarding the next appointment. BI-RADS CATEGORY  0: Incomplete. Need additional imaging evaluation and/or prior mammograms for comparison. Electronically Signed: By: Lovey Newcomer M.D. On: 02/10/2015 13:29   Mm Diag Breast Tomo Bilateral  Addendum Date: 02/24/2015   ADDENDUM REPORT: 02/24/2015 09:24 ADDENDUM: Outside prior mammograms dated 12/24/2013 were obtained and compared with current evaluation. After comparison with prior evaluation, current recommendation is unchanged. Recommendation is as follows: Six-month follow-up diagnostic mammography of the right and left breast to ensure stability of probably benign findings. Electronically Signed   By: Lovey Newcomer M.D.   On: 02/24/2015 09:24   Result Date: 02/10/2015 CLINICAL DATA:  Patient presents for evaluation of diffuse tenderness within the left breast. EXAM: DIGITAL DIAGNOSTIC BILATERAL  MAMMOGRAM WITH 3D TOMOSYNTHESIS WITH CAD ULTRASOUND LEFT BREAST COMPARISON:  None. ACR Breast Density Category c: The breast tissue is heterogeneously dense, which may obscure small masses. FINDINGS: Magnification CC and true lateral views demonstrate loosely grouped round and punctate calcifications within the subareolar aspect of the right breast extending to the 12 o'clock position. Bilateral dystrophic calcifications are  demonstrated. Additionally within the upper inner left breast posterior depth there is an 8 mm low-density oval circumscribed mass. Mammographic images were processed with CAD. On physical exam, I palpate no discrete mass within the upper inner left breast. Targeted ultrasound is performed, showing no suspicious mass within the upper inner left breast posterior depth. IMPRESSION: Patient has prior outside mammograms which have not been obtained prior to today's diagnostic evaluation. An attempt will be made to obtain these mammograms. Probably benign right breast calcifications. Probably benign left breast mass. RECOMMENDATION: Attempt to obtain outside prior mammograms and compare with current evaluation. If these do not become available, six-month follow-up diagnostic mammography of the right and left breast is recommended to ensure stability of probably benign findings. I have discussed the findings and recommendations with the patient. Results were also provided in writing at the conclusion of the visit. If applicable, a reminder letter will be sent to the patient regarding the next appointment. BI-RADS CATEGORY  0: Incomplete. Need additional imaging evaluation and/or prior mammograms for comparison. Electronically Signed: By: Lovey Newcomer M.D. On: 02/10/2015 13:29    Assessment & Plan:   Michelle Barker was seen today for headache, hypertension and annual exam.  Diagnoses and all orders for this visit:  Essential hypertension- Her blood pressure is not adequately well controlled and her potassium level is down to 3.0.  I have asked her to stop taking the thiazide diuretic and to change to a potassium sparing diuretic. -     Comprehensive metabolic panel; Future -     CBC with Differential/Platelet; Future -     Thyroid Panel With TSH; Future -     EKG 12-Lead -     Discontinue: chlorthalidone (HYGROTON) 25 MG tablet; Take 1 tablet (25 mg total) by mouth daily. Must keep appt for future refills -      spironolactone (ALDACTONE) 25 MG tablet; Take 1 tablet (25 mg total) by mouth daily.  Routine general medical examination at a health care facility- Exam completed, labs reviewed, she refused a flu vaccine today, she is referred for mammogram and Pap smear, will order a color guard to screen for colon cancer/polyps. -     Lipid panel; Future  Snoring- I have asked her to undergo a sleep study to screen for sleep apnea.  If she does have sleep apnea and it is diagnosed and treated then it will help control her blood pressure. -     Ambulatory referral to Sleep Studies  Intractable episodic headache, unspecified headache type- This may be related to her snoring and possibly sleep apnea.  She has neurological symptoms in the right upper extremity and a nocturnal component so I have asked her to undergo an MRI to screen for mass, tumor, CVA. -     MR Brain Wo Contrast; Future  Numbness and tingling of right arm- as above -     MR Brain Wo Contrast; Future  Visit for screening mammogram -     MM DIGITAL SCREENING BILATERAL; Future  Cervical cancer screening -     Ambulatory referral to Gynecology  Drug-induced hypokalemia -     spironolactone (  ALDACTONE) 25 MG tablet; Take 1 tablet (25 mg total) by mouth daily.   I have discontinued Braxton Portier's chlorthalidone and chlorthalidone. I am also having her start on spironolactone. Additionally, I am having her maintain her Ascorbic Acid (CHEW-C PO).  Meds ordered this encounter  Medications  . DISCONTD: chlorthalidone (HYGROTON) 25 MG tablet    Sig: Take 1 tablet (25 mg total) by mouth daily. Must keep appt for future refills    Dispense:  90 tablet    Refill:  0  . spironolactone (ALDACTONE) 25 MG tablet    Sig: Take 1 tablet (25 mg total) by mouth daily.    Dispense:  90 tablet    Refill:  0     Follow-up: Return in about 6 weeks (around 12/10/2017).  Scarlette Calico, MD

## 2017-10-29 NOTE — Patient Instructions (Signed)

## 2017-10-30 ENCOUNTER — Encounter: Payer: Self-pay | Admitting: Internal Medicine

## 2017-10-30 LAB — THYROID PANEL WITH TSH
Free Thyroxine Index: 2.6 (ref 1.4–3.8)
T3 Uptake: 30 % (ref 22–35)
T4, Total: 8.5 ug/dL (ref 5.1–11.9)
TSH: 2.07 mIU/L

## 2017-10-30 LAB — COMPREHENSIVE METABOLIC PANEL
ALT: 14 U/L (ref 0–35)
AST: 19 U/L (ref 0–37)
Albumin: 3.9 g/dL (ref 3.5–5.2)
Alkaline Phosphatase: 74 U/L (ref 39–117)
BUN: 16 mg/dL (ref 6–23)
CO2: 34 mEq/L — ABNORMAL HIGH (ref 19–32)
Calcium: 9.4 mg/dL (ref 8.4–10.5)
Chloride: 95 mEq/L — ABNORMAL LOW (ref 96–112)
Creatinine, Ser: 0.99 mg/dL (ref 0.40–1.20)
GFR: 74.84 mL/min (ref >60.00)
Glucose, Bld: 83 mg/dL (ref 70–99)
Potassium: 3 mEq/L — ABNORMAL LOW (ref 3.5–5.1)
Sodium: 138 mEq/L (ref 135–145)
Total Bilirubin: 0.4 mg/dL (ref 0.2–1.2)
Total Protein: 7.4 g/dL (ref 6.0–8.3)

## 2017-10-30 LAB — LIPID PANEL
Cholesterol: 193 mg/dL (ref 0–200)
HDL: 71.7 mg/dL (ref >39.00)
LDL Cholesterol: 104 mg/dL — ABNORMAL HIGH (ref 0–99)
NonHDL: 120.92
Total CHOL/HDL Ratio: 3
Triglycerides: 83 mg/dL (ref 0.0–149.0)
VLDL: 16.6 mg/dL (ref 0.0–40.0)

## 2017-10-30 MED ORDER — SPIRONOLACTONE 25 MG PO TABS: 25.0000 mg | ORAL_TABLET | Freq: Every day | ORAL | 0 refills | Status: DC

## 2017-10-31 ENCOUNTER — Telehealth: Payer: Self-pay

## 2017-10-31 NOTE — Telephone Encounter (Signed)
Order [id number]

## 2017-11-08 ENCOUNTER — Other Ambulatory Visit: Payer: Self-pay | Admitting: Obstetrics & Gynecology

## 2017-11-08 DIAGNOSIS — N644 Mastodynia: Secondary | ICD-10-CM

## 2017-11-08 DIAGNOSIS — R921 Mammographic calcification found on diagnostic imaging of breast: Secondary | ICD-10-CM

## 2017-11-08 DIAGNOSIS — N6320 Unspecified lump in the left breast, unspecified quadrant: Secondary | ICD-10-CM

## 2017-11-15 ENCOUNTER — Telehealth: Payer: Self-pay | Admitting: Neurology

## 2017-11-15 NOTE — Telephone Encounter (Signed)
I responded via [employer].

## 2017-11-15 NOTE — Telephone Encounter (Signed)
---   Message from Hugo, Generic sent at 11/15/2017 3:07 PM EST -----    Appointment Request From: [name]    With Provider: Star Age, MD [[facility]]    Preferred Date Range: Any date 12/06/2017 or later    Preferred Times: Any    Reason: To address the following health maintenance concerns.  Pap Smear    Comments:  prefer late afternoon

## 2017-11-17 ENCOUNTER — Ambulatory Visit
Admission: RE | Admit: 2017-11-17 | Discharge: 2017-11-17 | Disposition: A | Discharge status: Home / Self Care | Payer: BLUE CROSS/BLUE SHIELD | Source: Ambulatory Visit | Attending: Internal Medicine | Admitting: Internal Medicine

## 2017-11-17 ENCOUNTER — Encounter: Payer: Self-pay | Admitting: Internal Medicine

## 2017-11-17 DIAGNOSIS — R200 Anesthesia of skin: Secondary | ICD-10-CM

## 2017-11-17 DIAGNOSIS — R202 Paresthesia of skin: Secondary | ICD-10-CM

## 2017-11-17 DIAGNOSIS — R519 Headache, unspecified: Secondary | ICD-10-CM

## 2017-11-17 DIAGNOSIS — R51 Headache: Principal | ICD-10-CM

## 2017-11-21 ENCOUNTER — Ambulatory Visit
Admission: RE | Admit: 2017-11-21 | Discharge: 2017-11-21 | Disposition: A | Discharge status: Home / Self Care | Payer: BLUE CROSS/BLUE SHIELD | Source: Ambulatory Visit | Attending: Obstetrics & Gynecology | Admitting: Obstetrics & Gynecology

## 2017-11-21 ENCOUNTER — Ambulatory Visit: Payer: BLUE CROSS/BLUE SHIELD

## 2017-11-21 DIAGNOSIS — N6320 Unspecified lump in the left breast, unspecified quadrant: Secondary | ICD-10-CM

## 2017-11-21 DIAGNOSIS — R921 Mammographic calcification found on diagnostic imaging of breast: Secondary | ICD-10-CM

## 2017-12-03 ENCOUNTER — Telehealth: Payer: Self-pay

## 2017-12-03 ENCOUNTER — Institutional Professional Consult (permissible substitution): Payer: Self-pay | Admitting: Neurology

## 2017-12-03 NOTE — Telephone Encounter (Signed)
Pt did not show for their appt with Dr. Athar today.  

## 2017-12-04 ENCOUNTER — Encounter: Payer: Self-pay | Admitting: Neurology

## 2018-01-04 ENCOUNTER — Ambulatory Visit: Payer: Self-pay | Admitting: Gynecology

## 2018-01-29 ENCOUNTER — Telehealth: Payer: Self-pay | Admitting: Internal Medicine

## 2018-01-29 ENCOUNTER — Other Ambulatory Visit: Payer: Self-pay | Admitting: Internal Medicine

## 2018-01-29 DIAGNOSIS — I10 Essential (primary) hypertension: Secondary | ICD-10-CM

## 2018-01-29 DIAGNOSIS — T50905A Adverse effect of unspecified drugs, medicaments and biological substances, initial encounter: Secondary | ICD-10-CM

## 2018-01-29 DIAGNOSIS — E876 Hypokalemia: Secondary | ICD-10-CM

## 2018-01-29 NOTE — Telephone Encounter (Signed)
Pt was to follow up mid January 2019.   Please have pt come in for an appt. We need to recheck labs.

## 2018-01-30 MED ORDER — SPIRONOLACTONE 25 MG PO TABS: 25.0000 mg | ORAL_TABLET | Freq: Every day | ORAL | 0 refills | Status: DC

## 2018-01-30 NOTE — Telephone Encounter (Signed)
Patient is scheduled to see Dr Ronnald Ramp on 02/13/2018. Can this be sent in to get her though until her appointment?

## 2018-02-13 ENCOUNTER — Ambulatory Visit: Payer: BLUE CROSS/BLUE SHIELD | Admitting: Internal Medicine

## 2018-02-13 ENCOUNTER — Other Ambulatory Visit (INDEPENDENT_AMBULATORY_CARE_PROVIDER_SITE_OTHER): Payer: BLUE CROSS/BLUE SHIELD

## 2018-02-13 ENCOUNTER — Encounter: Payer: Self-pay | Admitting: Internal Medicine

## 2018-02-13 VITALS — BP 140/92 | HR 65 | Temp 98.2°F | Resp 16 | Ht 63.0 in | Wt 231.0 lb

## 2018-02-13 DIAGNOSIS — E876 Hypokalemia: Secondary | ICD-10-CM

## 2018-02-13 DIAGNOSIS — I10 Essential (primary) hypertension: Secondary | ICD-10-CM

## 2018-02-13 DIAGNOSIS — Z6841 Body Mass Index (BMI) 40.0 and over, adult: Secondary | ICD-10-CM | POA: Diagnosis not present

## 2018-02-13 LAB — BASIC METABOLIC PANEL
BUN: 12 mg/dL (ref 6–23)
CO2: 29 mEq/L (ref 19–32)
Calcium: 9.5 mg/dL (ref 8.4–10.5)
Chloride: 103 mEq/L (ref 96–112)
Creatinine, Ser: 0.86 mg/dL (ref 0.40–1.20)
GFR: 87.94 mL/min (ref >60.00)
Glucose, Bld: 85 mg/dL (ref 70–99)
Potassium: 4.3 mEq/L (ref 3.5–5.1)
Sodium: 138 mEq/L (ref 135–145)

## 2018-02-13 LAB — MAGNESIUM: Magnesium: 1.7 mg/dL (ref 1.5–2.5)

## 2018-02-13 MED ORDER — LORCASERIN HCL ER 20 MG PO TB24: 1.0000 | ORAL_TABLET | Freq: Every day | ORAL | 5 refills | Status: DC

## 2018-02-13 NOTE — Patient Instructions (Signed)

## 2018-02-13 NOTE — Progress Notes (Signed)
Subjective:  Patient ID: Michelle Barker, female    DOB: 09-03-1961  Age: 57 y.o. MRN: [id number]  CC: Hypertension   HPI Michelle Barker presents for a BP check - she is concerned that she has not been able to lose weight.  She has tried dietary restrictions and lifestyle modifications but continues to gain weight.  She is willing to start an oral medication to help reduce her caloric intake.  She tells me her blood pressure has been well controlled.  She is very active and denies DOE, CP, palpitations, edema, or fatigue.  Outpatient Medications Prior to Visit  Medication Sig Dispense Refill  . Ascorbic Acid (CHEW-C PO) Take 1 tablet by mouth daily as needed (for cold).    Marland Kitchen spironolactone (ALDACTONE) 25 MG tablet Take 1 tablet (25 mg total) by mouth daily. 30 tablet 0   No facility-administered medications prior to visit.     ROS Review of Systems  Constitutional: Positive for unexpected weight change. Negative for appetite change, diaphoresis and fatigue.  HENT: Negative.   Eyes: Negative for visual disturbance.  Respiratory: Negative.  Negative for apnea, cough, chest tightness, shortness of breath and wheezing.   Cardiovascular: Negative for chest pain, palpitations and leg swelling.  Gastrointestinal: Negative for abdominal pain, diarrhea, nausea and vomiting.  Endocrine: Negative.   Genitourinary: Negative.  Negative for difficulty urinating.  Musculoskeletal: Negative.  Negative for back pain and myalgias.  Skin: Negative.  Negative for color change and pallor.  Allergic/Immunologic: Negative.   Neurological: Negative.  Negative for dizziness, weakness, light-headedness and headaches.  Hematological: Negative for adenopathy. Does not bruise/bleed easily.  Psychiatric/Behavioral: Negative.     Objective:  BP (!) 140/92 (BP Location: Left Arm, Patient Position: Sitting, Cuff Size: Large)   Pulse 65   Temp 98.2 F (36.8 C) (Oral)   Resp 16   Ht 5\' 3"  (1.6 m)   Wt 231 lb  (104.8 kg)   SpO2 97%   BMI 40.92 kg/m   BP Readings from Last 3 Encounters:  02/13/18 (!) 140/92  10/29/17 (!) 144/90  02/13/17 (!) 148/100    Wt Readings from Last 3 Encounters:  02/13/18 231 lb (104.8 kg)  10/29/17 221 lb 4 oz (100.4 kg)  02/13/17 233 lb 1.3 oz (105.7 kg)    Physical Exam  Constitutional: She is oriented to person, place, and time. No distress.  HENT:  Mouth/Throat: Oropharynx is clear and moist. No oropharyngeal exudate.  Eyes: Conjunctivae are normal. Left eye exhibits no discharge. No scleral icterus.  Neck: Normal range of motion. Neck supple. No JVD present. No thyromegaly present.  Cardiovascular: Normal rate, regular rhythm and normal heart sounds. Exam reveals no gallop.  No murmur heard. Pulmonary/Chest: Effort normal and breath sounds normal. No respiratory distress. She has no wheezes. She has no rales.  Abdominal: Soft. Bowel sounds are normal. She exhibits no distension and no mass. There is no tenderness. There is no guarding.  Musculoskeletal: Normal range of motion. She exhibits no edema, tenderness or deformity.  Lymphadenopathy:    She has no cervical adenopathy.  Neurological: She is alert and oriented to person, place, and time.  Skin: Skin is warm and dry. No rash noted. She is not diaphoretic. No erythema. No pallor.  Vitals reviewed.   Lab Results  Component Value Date   WBC 4.9 10/29/2017   HGB 14.1 10/29/2017   HCT 42.5 10/29/2017   PLT 275.0 10/29/2017   GLUCOSE 85 02/13/2018   CHOL 193 10/29/2017  TRIG 83.0 10/29/2017   HDL 71.70 10/29/2017   LDLCALC 104 (H) 10/29/2017   ALT 14 10/29/2017   AST 19 10/29/2017   NA 138 02/13/2018   K 4.3 02/13/2018   CL 103 02/13/2018   CREATININE 0.86 02/13/2018   BUN 12 02/13/2018   CO2 29 02/13/2018   TSH 2.07 10/29/2017   HGBA1C 5.8 03/06/2016    Mm Diag Breast Tomo Bilateral  Result Date: 11/21/2017 CLINICAL DATA:  Two year follow-up of right breast calcifications and an  upper inner left breast mass. EXAM: 2D DIGITAL DIAGNOSTIC BILATERAL MAMMOGRAM WITH CAD AND ADJUNCT TOMO COMPARISON:  Previous exam(s). ACR Breast Density Category c: The breast tissue is heterogeneously dense, which may obscure small masses. FINDINGS: The loosely grouped calcifications in the right retroareolar region are stable. The mass in the medial superior left breast at a posterior depth containing fat centrally and coarse calcifications peripherally has not significantly changed since March of 2016. No other suspicious findings seen in either breast. Mammographic images were processed with CAD. IMPRESSION: No mammographic evidence of malignancy. RECOMMENDATION: Annual screening mammography. I have discussed the findings and recommendations with the patient. Results were also provided in writing at the conclusion of the visit. If applicable, a reminder letter will be sent to the patient regarding the next appointment. BI-RADS CATEGORY  2: Benign. Electronically Signed   By: Dorise Bullion III M.D   On: 11/21/2017 16:02    Assessment & Plan:   Michelle Barker was seen today for hypertension.  Diagnoses and all orders for this visit:  Essential hypertension- Her blood pressure is well controlled.  Electrolytes and renal function are normal now. -     Basic metabolic panel; Future  Hypokalemia- Her potassium level is in the normal range.  Will continue the potassium sparing diuretic. -     Basic metabolic panel; Future -     Magnesium; Future  Morbid obesity with BMI of 40.0-44.9, adult ([place])- In addition to lifestyle modifications she will take Michelle Barker to help her lose weight. -     Lorcaserin HCl ER (Michelle Barker XR) 20 MG TB24; Take 1 tablet by mouth daily.   I am having Michelle Barker start on Lorcaserin HCl ER. I am also having her maintain her Ascorbic Acid (CHEW-C PO) and spironolactone.  Meds ordered this encounter  Medications  . Lorcaserin HCl ER (Michelle Barker XR) 20 MG TB24    Sig: Take 1 tablet by  mouth daily.    Dispense:  30 tablet    Refill:  5     Follow-up: Return in about 6 months (around 08/15/2018).  Michelle Calico, MD

## 2018-02-14 ENCOUNTER — Encounter: Payer: Self-pay | Admitting: Internal Medicine

## 2018-02-18 ENCOUNTER — Encounter: Payer: Self-pay | Admitting: Internal Medicine

## 2018-02-20 NOTE — Telephone Encounter (Signed)
03/11/2017: Cancelled - Order Expired

## 2018-02-21 ENCOUNTER — Telehealth: Payer: Self-pay

## 2018-02-21 NOTE — Telephone Encounter (Signed)
Key Annett Gula request with Cover [employer] today. Pending approval.

## 2018-02-22 ENCOUNTER — Ambulatory Visit: Payer: Self-pay | Admitting: Gynecology

## 2018-02-27 NOTE — Telephone Encounter (Signed)
Key: Michelle Barker

## 2018-03-01 ENCOUNTER — Other Ambulatory Visit: Payer: Self-pay | Admitting: Internal Medicine

## 2018-03-01 DIAGNOSIS — I10 Essential (primary) hypertension: Secondary | ICD-10-CM

## 2018-03-01 DIAGNOSIS — E876 Hypokalemia: Secondary | ICD-10-CM

## 2018-03-01 DIAGNOSIS — T50905A Adverse effect of unspecified drugs, medicaments and biological substances, initial encounter: Secondary | ICD-10-CM

## 2018-03-04 ENCOUNTER — Telehealth: Payer: Self-pay | Admitting: Internal Medicine

## 2018-03-04 NOTE — Telephone Encounter (Signed)
Copied from [place] [id number]. Topic: Quick Communication - See Telephone Encounter >> Mar 04, 2018 11:47 AM Rutherford Nail, NT wrote: CRM for notification. See Telephone encounter for: 03/04/18. Patient calling in regards to the status of the paperwork for the Lorcaserin HCl ER (BELVIQ XR) 20 MG TB24. Please advise. CB#: [phone]

## 2018-03-04 NOTE — Telephone Encounter (Signed)
Copied from [place] [id number]. Topic: Quick Communication - Rx Refill/Question >> Mar 04, 2018 11:42 AM Selinda Flavin B, NT wrote: Medication: spironolactone (ALDACTONE) 25 MG tablet Has the patient contacted their pharmacy? Yes.   (Agent: If no, request that the patient contact the pharmacy for the refill.) Preferred Pharmacy (with phone number or street name): [facility], [place] - [address] Agent: Please be advised that RX refills may take up to 3 business days. We ask that you follow-up with your pharmacy.

## 2018-03-04 NOTE — Telephone Encounter (Signed)
Rx sent to pharmacy by provider yesterday.

## 2018-03-05 NOTE — Telephone Encounter (Signed)
Sent pt a mychart

## 2018-03-06 ENCOUNTER — Encounter: Payer: Self-pay | Admitting: Internal Medicine

## 2018-04-04 ENCOUNTER — Encounter: Payer: Self-pay | Admitting: Internal Medicine

## 2018-04-05 ENCOUNTER — Ambulatory Visit: Payer: BLUE CROSS/BLUE SHIELD | Admitting: Family

## 2018-04-05 DIAGNOSIS — B351 Tinea unguium: Secondary | ICD-10-CM | POA: Diagnosis not present

## 2018-04-05 DIAGNOSIS — L03039 Cellulitis of unspecified toe: Secondary | ICD-10-CM | POA: Diagnosis not present

## 2018-04-17 ENCOUNTER — Ambulatory Visit: Payer: BLUE CROSS/BLUE SHIELD | Admitting: Gynecology

## 2018-04-17 ENCOUNTER — Encounter: Payer: Self-pay | Admitting: Gynecology

## 2018-04-17 VITALS — BP 132/82 | Ht 62.0 in | Wt 235.0 lb

## 2018-04-17 DIAGNOSIS — Z8249 Family history of ischemic heart disease and other diseases of the circulatory system: Secondary | ICD-10-CM

## 2018-04-17 DIAGNOSIS — Z1151 Encounter for screening for human papillomavirus (HPV): Secondary | ICD-10-CM | POA: Diagnosis not present

## 2018-04-17 DIAGNOSIS — Z01411 Encounter for gynecological examination (general) (routine) with abnormal findings: Secondary | ICD-10-CM

## 2018-04-17 DIAGNOSIS — N952 Postmenopausal atrophic vaginitis: Secondary | ICD-10-CM

## 2018-04-17 DIAGNOSIS — N951 Menopausal and female climacteric states: Secondary | ICD-10-CM

## 2018-04-17 LAB — HM PAP SMEAR

## 2018-04-17 NOTE — Progress Notes (Signed)
Michelle Barker 01-06-1961 [id number]        57 y.o.  G2P2 new patient for annual gynecologic exam.  Patient also complaining of hot flushes, night sweats and vaginal dryness.  Last menstrual period approximately 4 years ago.  Had significant hot flushes and night sweats then but these seem to get better until most recently when they have recurred.  Now having more issues with vaginal dryness.  Not currently sexually active.  No vaginal discharge odor or itching.  No urinary symptoms such as frequency dysuria urgency low back pain fever or chills.  Has not had a gynecologic exam in several years.  Past medical history,surgical history, problem list, medications, allergies, family history and social history were all reviewed and documented as reviewed in the EPIC chart.  ROS:  Performed with pertinent positives and negatives included in the history, assessment and plan.   Additional significant findings : None   Exam: Caryn Bee assistant Vitals:   04/17/18 1517  BP: 132/82  Weight: 235 lb (106.6 kg)  Height: 5\' 2"  (1.575 m)   Body mass index is 42.98 kg/m.  General appearance:  Normal affect, orientation and appearance. Skin: Grossly normal HEENT: Without gross lesions.  No cervical or supraclavicular adenopathy. Thyroid normal.  Lungs:  Clear without wheezing, rales or rhonchi Cardiac: RR, without RMG Abdominal:  Soft, nontender, without masses, guarding, rebound, organomegaly or hernia Breasts:  Examined lying and sitting without masses, retractions, discharge or axillary adenopathy. Pelvic:  Ext, BUS, Vagina: With atrophic changes  Cervix: With atrophic changes.  Pap smear/HPV done  Uterus: Anteverted, normal size, shape and contour, midline and mobile nontender   Adnexa: Without masses or tenderness    Anus and perineum: Normal   Rectovaginal: Normal sphincter tone without palpated masses or tenderness.    Assessment/Plan:  57 y.o. G2P2 female for annual gynecologic  exam.   1. Postmenopausal/menopausal symptoms.  Patient with hot flushes, night sweats and vaginal dryness.  Options for management reviewed to include OTC products such as soy based and vaginal moisturizers/lubricants.  Pharmacologic nonhormonal such as Effexor as well as HRT discussed.  The various studies pertaining to HRT were reviewed to include the WHI study and increased risk of stroke heart attack DVT and breast cancer.  Benefits as far as cardiovascular bone health and symptom relief were also discussed.  Various delivery routes to include oral, transdermal and transvaginal and the first-pass effect benefits of transdermal were reviewed.  The patient does have a sister who died of a PE at age 29 and her mother who is in her 60s has developed apparently a PE also.  No other family history of thrombosis.  I reviewed with her the issue as to whether to evaluate her for a familial coagulopathy.  Given that were considering HRT and this would increase the risk of thrombosis I think it is most prudent to screen now and will go ahead and check for lupus anticoagulant, Antithrombin III, protein C, protein S, Leiden factor V and homocystine.  She will follow-up for these results and then will rediscuss HRT at that time.  If she is interested at that point then will consider estradiol patch and Prometrium at bedtime. 2. Mammography 11/2017.  Continue with annual mammography when due.  Breast exam normal today. 3. Pap smear 2016.  Pap smear done today.  No history of abnormal Pap smears previously. 4. Colonoscopy never.  Discussed second most common cancer in women.  Increased risk in African-American population.  Recommended  screening colonoscopy and she agrees to call and schedule.  Names and numbers provided. 5. DEXA never.  Will plan further into the menopause. 6. Health maintenance.  No routine lab work done as patient does this elsewhere.  Follow-up for blood work results and HRT decision otherwise  follow-up in 1 year, sooner as needed.  Additional time in excess of her routine gynecologic exam was spent in direct face to face counseling and coordination of care in regards to her menopausal symptoms and treatment options.    Anastasio Auerbach MD, 4:57 PM 04/17/2018

## 2018-04-17 NOTE — Patient Instructions (Signed)
Schedule your colonoscopy with either:  [facility]   Address: [address], [place], [place] [address]  [phone]    or  [facility]  Address: [address], [place], [place] [address]  [phone]      

## 2018-04-18 LAB — PAP IG AND HPV HIGH-RISK: HPV DNA High Risk: NOT DETECTED

## 2018-04-19 LAB — LUPUS ANTICOAGULANT
Dilute Viper Venom Time: 30.9 s (ref 0.0–47.0)
PTT Lupus Anticoagulant: 32.3 s (ref 0.0–51.9)
Thrombin Time: 18.4 s (ref 0.0–23.0)
dPT Confirm Ratio: 1.06 Ratio (ref 0.00–1.40)
dPT: 40.4 s (ref 0.0–55.0)

## 2018-04-24 ENCOUNTER — Encounter: Payer: Self-pay | Admitting: Gynecology

## 2018-04-24 NOTE — Telephone Encounter (Signed)
On Pap smear:  INFECTION:    Comment: Shift in vaginal flora suggestive of bacterial  vaginosis.

## 2018-04-25 NOTE — Telephone Encounter (Signed)
Bacterial vaginosis is when 1 of the bacteria in the vagina becomes more dominant and can cause an infection with symptoms such as odor, discharge, irritation or itching.  In a patient that is having no symptoms I do not usually treat a Pap smear result that shows a shift in vaginal flora as this is a reading from the cytologist and may not really reflect an infection.

## 2018-04-29 ENCOUNTER — Ambulatory Visit: Payer: Self-pay | Admitting: Gynecology

## 2018-04-30 LAB — ANTITHROMBIN III: AntiThromb III Func: 97 % activity (ref 80–120)

## 2018-04-30 LAB — HOMOCYSTEINE: Homocysteine: 8.1 umol/L (ref <10.4)

## 2018-04-30 LAB — PROTEIN C, TOTAL: PROTEIN C, ANTIGEN: 89 % (ref 70–140)

## 2018-04-30 LAB — FACTOR 5 LEIDEN

## 2018-04-30 LAB — PROTEIN S, TOTAL: PROTEIN S ANTIGEN, TOTAL: 88 % normal (ref 70–140)

## 2018-05-20 ENCOUNTER — Encounter: Payer: Self-pay | Admitting: Gynecology

## 2018-05-20 NOTE — Telephone Encounter (Signed)
Tell patient that the lab work all returned negative for increased risk for thrombosis.  Recommend Vivelle 0.05 mg patch and Prometrium 100 mg nightly 66-month supply.  Have patient call me within the 3 months to let me know how she is doing.  If need be we can adjust the patch stronger if she continues to have significant hot flushes and sweats.

## 2018-05-21 ENCOUNTER — Other Ambulatory Visit: Payer: Self-pay | Admitting: Gynecology

## 2018-05-21 MED ORDER — PROGESTERONE MICRONIZED 100 MG PO CAPS: 100.0000 mg | ORAL_CAPSULE | Freq: Every day | ORAL | 2 refills | Status: DC

## 2018-05-21 MED ORDER — ESTRADIOL 0.05 MG/24HR TD PTTW: 1.0000 | MEDICATED_PATCH | TRANSDERMAL | 2 refills | Status: DC

## 2018-06-25 ENCOUNTER — Encounter: Payer: Self-pay | Admitting: Internal Medicine

## 2018-06-25 DIAGNOSIS — Z1212 Encounter for screening for malignant neoplasm of rectum: Secondary | ICD-10-CM

## 2018-06-25 DIAGNOSIS — Z1211 Encounter for screening for malignant neoplasm of colon: Secondary | ICD-10-CM

## 2018-06-28 ENCOUNTER — Encounter: Payer: Self-pay | Admitting: Internal Medicine

## 2018-07-01 ENCOUNTER — Encounter: Payer: Self-pay | Admitting: Gastroenterology

## 2018-07-11 ENCOUNTER — Ambulatory Visit: Payer: BLUE CROSS/BLUE SHIELD | Admitting: Internal Medicine

## 2018-07-17 ENCOUNTER — Encounter: Payer: Self-pay | Admitting: Gynecology

## 2018-07-17 ENCOUNTER — Ambulatory Visit (INDEPENDENT_AMBULATORY_CARE_PROVIDER_SITE_OTHER): Payer: BLUE CROSS/BLUE SHIELD | Admitting: Internal Medicine

## 2018-07-17 ENCOUNTER — Other Ambulatory Visit (INDEPENDENT_AMBULATORY_CARE_PROVIDER_SITE_OTHER): Payer: BLUE CROSS/BLUE SHIELD

## 2018-07-17 ENCOUNTER — Encounter: Payer: Self-pay | Admitting: Internal Medicine

## 2018-07-17 VITALS — BP 130/84 | HR 64 | Temp 98.4°F | Resp 16 | Ht 62.0 in | Wt 237.0 lb

## 2018-07-17 DIAGNOSIS — E876 Hypokalemia: Secondary | ICD-10-CM

## 2018-07-17 DIAGNOSIS — R600 Localized edema: Secondary | ICD-10-CM

## 2018-07-17 DIAGNOSIS — R0789 Other chest pain: Secondary | ICD-10-CM | POA: Diagnosis not present

## 2018-07-17 DIAGNOSIS — I10 Essential (primary) hypertension: Secondary | ICD-10-CM

## 2018-07-17 LAB — CBC WITH DIFFERENTIAL/PLATELET
Basophils Absolute: 0 10*3/uL (ref 0.0–0.1)
Basophils Relative: 0.8 % (ref 0.0–3.0)
Eosinophils Absolute: 0 10*3/uL (ref 0.0–0.7)
Eosinophils Relative: 1.2 % (ref 0.0–5.0)
HCT: 42.1 % (ref 36.0–46.0)
Hemoglobin: 14.2 g/dL (ref 12.0–15.0)
Lymphocytes Relative: 46.7 % — ABNORMAL HIGH (ref 12.0–46.0)
Lymphs Abs: 1.8 10*3/uL (ref 0.7–4.0)
MCHC: 33.8 g/dL (ref 30.0–36.0)
MCV: 89.2 fl (ref 78.0–100.0)
Monocytes Absolute: 0.3 10*3/uL (ref 0.1–1.0)
Monocytes Relative: 7.7 % (ref 3.0–12.0)
Neutro Abs: 1.7 10*3/uL (ref 1.4–7.7)
Neutrophils Relative %: 43.6 % (ref 43.0–77.0)
Platelets: 272 10*3/uL (ref 150.0–400.0)
RBC: 4.72 Mil/uL (ref 3.87–5.11)
RDW: 14.1 % (ref 11.5–15.5)
WBC: 3.8 10*3/uL — ABNORMAL LOW (ref 4.0–10.5)

## 2018-07-17 LAB — TROPONIN I: TNIDX: 0.01 ug/l (ref 0.00–0.06)

## 2018-07-17 NOTE — Progress Notes (Signed)
Subjective:  Patient ID: Michelle Barker, female    DOB: 05/13/1961  Age: 57 y.o. MRN: [id number]  CC: Hypertension   HPI Michelle Barker presents for f/up- She complains of a 6-week history of feeling like her calves are larger than usual.  There is no pain, edema, or claudication.  She had a fleeting episode of chest pain 2 days ago at rest.  She denies DOE, diaphoresis, lightheadedness, or dizziness.  Outpatient Medications Prior to Visit  Medication Sig Dispense Refill  . estradiol (VIVELLE-DOT) 0.05 MG/24HR patch Place 1 patch (0.05 mg total) onto the skin 2 (two) times a week. 8 patch 2  . progesterone (PROMETRIUM) 100 MG capsule Take 1 capsule (100 mg total) by mouth at bedtime. 30 capsule 2  . spironolactone (ALDACTONE) 25 MG tablet TAKE 1 TABLET BY MOUTH ONCE DAILY 90 tablet 1  . Ascorbic Acid (CHEW-C PO) Take 1 tablet by mouth daily as needed (for cold).    . dicloxacillin (DYNAPEN) 250 MG capsule Take 250 mg by mouth 4 (four) times daily.     No facility-administered medications prior to visit.     ROS Review of Systems  Constitutional: Negative.  Negative for diaphoresis and fatigue.  HENT: Negative.   Eyes: Negative for visual disturbance.  Respiratory: Negative for cough, chest tightness and shortness of breath.   Cardiovascular: Positive for chest pain and leg swelling. Negative for palpitations.       She had an episode of chest pain 2 days ago there is currently no chest pain.  Gastrointestinal: Negative for abdominal pain, constipation, diarrhea, nausea and vomiting.  Endocrine: Negative.   Genitourinary: Negative.  Negative for difficulty urinating.  Musculoskeletal: Negative.  Negative for arthralgias and myalgias.  Skin: Negative.  Negative for color change.  Neurological: Negative.  Negative for dizziness, weakness and light-headedness.  Hematological: Negative for adenopathy. Does not bruise/bleed easily.  Psychiatric/Behavioral: Negative.     Objective:    BP 130/84 (BP Location: Left Arm, Patient Position: Sitting, Cuff Size: Large)   Pulse 64   Temp 98.4 F (36.9 C) (Oral)   Resp 16   Ht 5\' 2"  (1.575 m)   Wt 237 lb (107.5 kg)   SpO2 98%   BMI 43.35 kg/m   BP Readings from Last 3 Encounters:  07/17/18 130/84  04/17/18 132/82  02/13/18 (!) 140/92    Wt Readings from Last 3 Encounters:  07/17/18 237 lb (107.5 kg)  04/17/18 235 lb (106.6 kg)  02/13/18 231 lb (104.8 kg)    Physical Exam  Constitutional: She is oriented to person, place, and time. No distress.  HENT:  Mouth/Throat: Oropharynx is clear and moist. No oropharyngeal exudate.  Eyes: Conjunctivae are normal. No scleral icterus.  Neck: Normal range of motion. Neck supple. No JVD present. No thyromegaly present.  Cardiovascular: Normal rate, regular rhythm and normal heart sounds. Exam reveals no gallop and no friction rub.  No murmur heard. EKG ---  Sinus  Rhythm  -  Nonspecific T-abnormality.   ABNORMAL   Pulmonary/Chest: Effort normal and breath sounds normal. No respiratory distress. She has no wheezes. She has no rales.  Abdominal: Soft. Bowel sounds are normal. She exhibits no mass. There is no hepatosplenomegaly. There is no tenderness.  Musculoskeletal: Normal range of motion. She exhibits no edema, tenderness or deformity.  There is no edema  Lymphadenopathy:    She has no cervical adenopathy.  Neurological: She is alert and oriented to person, place, and time.  Skin: Skin is  warm and dry. She is not diaphoretic. No pallor.  Vitals reviewed.   Lab Results  Component Value Date   WBC 3.8 (L) 07/17/2018   HGB 14.2 07/17/2018   HCT 42.1 07/17/2018   PLT 272.0 07/17/2018   GLUCOSE 86 07/17/2018   CHOL 193 10/29/2017   TRIG 83.0 10/29/2017   HDL 71.70 10/29/2017   LDLCALC 104 (H) 10/29/2017   ALT 14 10/29/2017   AST 19 10/29/2017   NA 137 07/17/2018   K 4.1 07/17/2018   CL 102 07/17/2018   CREATININE 0.94 07/17/2018   BUN 14 07/17/2018   CO2  23 07/17/2018   TSH 2.24 07/17/2018   HGBA1C 5.8 03/06/2016    Mm Diag Breast Tomo Bilateral  Result Date: 11/21/2017 CLINICAL DATA:  Two year follow-up of right breast calcifications and an upper inner left breast mass. EXAM: 2D DIGITAL DIAGNOSTIC BILATERAL MAMMOGRAM WITH CAD AND ADJUNCT TOMO COMPARISON:  Previous exam(s). ACR Breast Density Category c: The breast tissue is heterogeneously dense, which may obscure small masses. FINDINGS: The loosely grouped calcifications in the right retroareolar region are stable. The mass in the medial superior left breast at a posterior depth containing fat centrally and coarse calcifications peripherally has not significantly changed since March of 2016. No other suspicious findings seen in either breast. Mammographic images were processed with CAD. IMPRESSION: No mammographic evidence of malignancy. RECOMMENDATION: Annual screening mammography. I have discussed the findings and recommendations with the patient. Results were also provided in writing at the conclusion of the visit. If applicable, a reminder letter will be sent to the patient regarding the next appointment. BI-RADS CATEGORY  2: Benign. Electronically Signed   By: Dorise Bullion III M.D   On: 11/21/2017 16:02    Assessment & Plan:   Michelle Barker was seen today for hypertension.  Diagnoses and all orders for this visit:  Atypical chest pain- She had a brief episode of chest pain that is not concerning for a cardiovascular cause.  Her EKG is negative for ischemia or any other changes.  Her d-dimer is not significantly elevated and her troponin level is negative.  I think she had an atypical cause for chest pain that does not require any further evaluation at this time. -     D-dimer, quantitative (not at [facility]); Future -     Troponin I; Future -     EKG 12-Lead  Localized edema- Her lower extremities are large but there is no edema.  Work-up for secondary causes of edema is unremarkable.  She will  elevate her lower extremities and continue taking a diuretic. -     TSH; Future -     D-dimer, quantitative (not at [facility]); Future -     Brain natriuretic peptide; Future  Hypokalemia- Her potassium level is normal now at 4.1.  Will continue the potassium sparing diuretic at the current dose. -     Basic metabolic panel; Future  Essential hypertension- Her blood pressure is adequately well controlled.  Electrolytes and renal function are normal. -     TSH; Future -     Basic metabolic panel; Future -     CBC with Differential/Platelet; Future   I have discontinued Michelle Barker's Ascorbic Acid (CHEW-C PO) and dicloxacillin. I am also having her maintain her spironolactone, estradiol, and progesterone.  No orders of the defined types were placed in this encounter.    Follow-up: Return in about 3 weeks (around 08/07/2018).  Scarlette Calico, MD

## 2018-07-17 NOTE — Patient Instructions (Signed)
Edema Edema is an abnormal buildup of fluids in your bodytissues. Edema is somewhatdependent on gravity to pull the fluid to the lowest place in your body. That makes the condition more common in the legs and thighs (lower extremities). Painless swelling of the feet and ankles is common and becomes more likely as you get older. It is also common in looser tissues, like around your eyes. When the affected area is squeezed, the fluid may move out of that spot and leave a dent for a few moments. This dent is called pitting. What are the causes? There are many possible causes of edema. Eating too much salt and being on your feet or sitting for a long time can cause edema in your legs and ankles. Hot weather may make edema worse. Common medical causes of edema include:  Heart failure.  Liver disease.  Kidney disease.  Weak blood vessels in your legs.  Cancer.  An injury.  Pregnancy.  Some medications.  Obesity.  What are the signs or symptoms? Edema is usually painless.Your skin may look swollen or shiny. How is this diagnosed? Your health care provider may be able to diagnose edema by asking about your medical history and doing a physical exam. You may need to have tests such as X-rays, an electrocardiogram, or blood tests to check for medical conditions that may cause edema. How is this treated? Edema treatment depends on the cause. If you have heart, liver, or kidney disease, you need the treatment appropriate for these conditions. General treatment may include:  Elevation of the affected body part above the level of your heart.  Compression of the affected body part. Pressure from elastic bandages or support stockings squeezes the tissues and forces fluid back into the blood vessels. This keeps fluid from entering the tissues.  Restriction of fluid and salt intake.  Use of a water pill (diuretic). These medications are appropriate only for some types of edema. They pull fluid  out of your body and make you urinate more often. This gets rid of fluid and reduces swelling, but diuretics can have side effects. Only use diuretics as directed by your health care provider.  Follow these instructions at home:  Keep the affected body part above the level of your heart when you are lying down.  Do not sit still or stand for prolonged periods.  Do not put anything directly under your knees when lying down.  Do not wear constricting clothing or garters on your upper legs.  Exercise your legs to work the fluid back into your blood vessels. This may help the swelling go down.  Wear elastic bandages or support stockings to reduce ankle swelling as directed by your health care provider.  Eat a low-salt diet to reduce fluid if your health care provider recommends it.  Only take medicines as directed by your health care provider. Contact a health care provider if:  Your edema is not responding to treatment.  You have heart, liver, or kidney disease and notice symptoms of edema.  You have edema in your legs that does not improve after elevating them.  You have sudden and unexplained weight gain. Get help right away if:  You develop shortness of breath or chest pain.  You cannot breathe when you lie down.  You develop pain, redness, or warmth in the swollen areas.  You have heart, liver, or kidney disease and suddenly get edema.  You have a fever and your symptoms suddenly get worse. This information is   not intended to replace advice given to you by your health care provider. Make sure you discuss any questions you have with your health care provider. Document Released: 10/30/2005 Document Revised: 04/06/2016 Document Reviewed: 08/22/2013 Elsevier Interactive Patient Education  2017 [employer]  

## 2018-07-18 ENCOUNTER — Encounter: Payer: Self-pay | Admitting: Internal Medicine

## 2018-07-18 LAB — BASIC METABOLIC PANEL
BUN: 14 mg/dL (ref 6–23)
CO2: 23 mEq/L (ref 19–32)
Calcium: 9.5 mg/dL (ref 8.4–10.5)
Chloride: 102 mEq/L (ref 96–112)
Creatinine, Ser: 0.94 mg/dL (ref 0.40–1.20)
GFR: 79.24 mL/min (ref >60.00)
Glucose, Bld: 86 mg/dL (ref 70–99)
Potassium: 4.1 mEq/L (ref 3.5–5.1)
Sodium: 137 mEq/L (ref 135–145)

## 2018-07-18 LAB — BRAIN NATRIURETIC PEPTIDE: Pro B Natriuretic peptide (BNP): 10 pg/mL (ref 0.0–100.0)

## 2018-07-18 LAB — TSH: TSH: 2.24 u[IU]/mL (ref 0.35–4.50)

## 2018-07-18 LAB — D-DIMER, QUANTITATIVE: D-Dimer, Quant: 0.74 mcg/mL FEU — ABNORMAL HIGH (ref <0.50)

## 2018-08-02 ENCOUNTER — Ambulatory Visit (AMBULATORY_SURGERY_CENTER): Payer: Self-pay

## 2018-08-02 ENCOUNTER — Encounter: Payer: Self-pay | Admitting: Gastroenterology

## 2018-08-02 VITALS — Ht 63.0 in | Wt 237.4 lb

## 2018-08-02 DIAGNOSIS — Z800 Family history of malignant neoplasm of digestive organs: Secondary | ICD-10-CM

## 2018-08-02 MED ORDER — NA SULFATE-K SULFATE-MG SULF 17.5-3.13-1.6 GM/177ML PO SOLN: 1.0000 | Freq: Once | ORAL | 0 refills | Status: AC

## 2018-08-02 NOTE — Progress Notes (Signed)
No egg or soy allergy known to patient  No issues with past sedation with any surgeries  or procedures, no intubation problems  No diet pills per patient No home 02 use per patient  No blood thinners per patient  Pt denies issues with constipation  No A fib or A flutter  EMMI video sent to pt's e mail  

## 2018-08-15 LAB — HM COLONOSCOPY

## 2018-08-16 ENCOUNTER — Encounter: Payer: Self-pay | Admitting: Gastroenterology

## 2018-08-16 ENCOUNTER — Ambulatory Visit (AMBULATORY_SURGERY_CENTER): Payer: BLUE CROSS/BLUE SHIELD | Admitting: Gastroenterology

## 2018-08-16 VITALS — BP 116/75 | HR 71 | Temp 98.6°F | Resp 15

## 2018-08-16 DIAGNOSIS — D123 Benign neoplasm of transverse colon: Secondary | ICD-10-CM

## 2018-08-16 DIAGNOSIS — Z1211 Encounter for screening for malignant neoplasm of colon: Secondary | ICD-10-CM | POA: Diagnosis not present

## 2018-08-16 DIAGNOSIS — Z800 Family history of malignant neoplasm of digestive organs: Secondary | ICD-10-CM | POA: Diagnosis not present

## 2018-08-16 DIAGNOSIS — D175 Benign lipomatous neoplasm of intra-abdominal organs: Secondary | ICD-10-CM | POA: Diagnosis not present

## 2018-08-16 DIAGNOSIS — D128 Benign neoplasm of rectum: Secondary | ICD-10-CM

## 2018-08-16 DIAGNOSIS — D120 Benign neoplasm of cecum: Secondary | ICD-10-CM

## 2018-08-16 DIAGNOSIS — K635 Polyp of colon: Secondary | ICD-10-CM

## 2018-08-16 DIAGNOSIS — K5730 Diverticulosis of large intestine without perforation or abscess without bleeding: Secondary | ICD-10-CM

## 2018-08-16 DIAGNOSIS — K5140 Inflammatory polyps of colon without complications: Secondary | ICD-10-CM | POA: Diagnosis not present

## 2018-08-16 MED ORDER — SODIUM CHLORIDE 0.9 % IV SOLN: 500.0000 mL | Freq: Once | INTRAVENOUS | Status: DC

## 2018-08-16 NOTE — Patient Instructions (Signed)
Handouts:  Polyps   Use Fiber as needed:  Fibercon, or Citrucel, or Metamucil  YOU HAD AN ENDOSCOPIC PROCEDURE TODAY AT [facility]:   Refer to the procedure report that was given to you for any specific questions about what was found during the examination.  If the procedure report does not answer your questions, please call your gastroenterologist to clarify.  If you requested that your care partner not be given the details of your procedure findings, then the procedure report has been included in a sealed envelope for you to review at your convenience later.  YOU SHOULD EXPECT: Some feelings of bloating in the abdomen. Passage of more gas than usual.  Walking can help get rid of the air that was put into your GI tract during the procedure and reduce the bloating. If you had a lower endoscopy (such as a colonoscopy or flexible sigmoidoscopy) you may notice spotting of blood in your stool or on the toilet paper. If you underwent a bowel prep for your procedure, you may not have a normal bowel movement for a few days.  Please Note:  You might notice some irritation and congestion in your nose or some drainage.  This is from the oxygen used during your procedure.  There is no need for concern and it should clear up in a day or so.  SYMPTOMS TO REPORT IMMEDIATELY:   Following lower endoscopy (colonoscopy or flexible sigmoidoscopy):  Excessive amounts of blood in the stool  Significant tenderness or worsening of abdominal pains  Swelling of the abdomen that is new, acute  Fever of 100F or higher   For urgent or emergent issues, a gastroenterologist can be reached at any hour by calling [phone].   DIET:  We do recommend a small meal at first, but then you may proceed to your regular diet.  Drink plenty of fluids but you should avoid alcoholic beverages for 24 hours.  ACTIVITY:  You should plan to take it easy for the rest of today and you should NOT DRIVE or use heavy  machinery until tomorrow (because of the sedation medicines used during the test).    FOLLOW UP: Our staff will call the number listed on your records the next business day following your procedure to check on you and address any questions or concerns that you may have regarding the information given to you following your procedure. If we do not reach you, we will leave a message.  However, if you are feeling well and you are not experiencing any problems, there is no need to return our call.  We will assume that you have returned to your regular daily activities without incident.  If any biopsies were taken you will be contacted by phone or by letter within the next 1-3 weeks.  Please call us at [phone] if you have not heard about the biopsies in 3 weeks.    SIGNATURES/CONFIDENTIALITY: You and/or your care partner have signed paperwork which will be entered into your electronic medical record.  These signatures attest to the fact that that the information above on your After Visit Summary has been reviewed and is understood.  Full responsibility of the confidentiality of this discharge information lies with you and/or your care-partner.

## 2018-08-16 NOTE — Progress Notes (Signed)
Pt's states no medical or surgical changes since previsit or office visit. 

## 2018-08-16 NOTE — Progress Notes (Signed)
Called to room to assist during endoscopic procedure.  Patient ID and intended procedure confirmed with present staff. Received instructions for my participation in the procedure from the performing physician.  

## 2018-08-16 NOTE — Op Note (Signed)
[place] Patient Name: Michelle Barker Procedure Date: 08/16/2018 11:51 AM MRN: [id number] Endoscopist: Gerrit Heck , MD Age: 57 Referring MD:  Date of Birth: 05-Mar-1961 Gender: Female Account #: [id number] Procedure:                Colonoscopy Indications:              Screening patient at increased risk: Family history                            of 1st-degree relative with colorectal cancer at                            age 34 years (mother; diagnosed at age 104), This is                            the patient's first colonoscopy Medicines:                Monitored Anesthesia Care Procedure:                Pre-Anesthesia Assessment:                           - Prior to the procedure, a History and Physical                            was performed, and patient medications and                            allergies were reviewed. The patient's tolerance of                            previous anesthesia was also reviewed. The risks                            and benefits of the procedure and the sedation                            options and risks were discussed with the patient.                            All questions were answered, and informed consent                            was obtained. Prior Anticoagulants: The patient has                            taken no previous anticoagulant or antiplatelet                            agents. ASA Grade Assessment: II - A patient with                            mild systemic disease. After reviewing the risks  and benefits, the patient was deemed in                            satisfactory condition to undergo the procedure.                           After obtaining informed consent, the colonoscope                            was passed under direct vision. Throughout the                            procedure, the patient's blood pressure, pulse, and                            oxygen saturations were  monitored continuously. The                            Colonoscope was introduced through the anus and                            advanced to the the cecum, identified by                            appendiceal orifice and ileocecal valve. The                            colonoscopy was performed without difficulty. The                            patient tolerated the procedure well. The quality                            of the bowel preparation was good. Scope In: 12:17:11 PM Scope Out: 58:09:98 PM Scope Withdrawal Time: 0 hours 15 minutes 5 seconds  Total Procedure Duration: 0 hours 19 minutes 27 seconds  Findings:                 The perianal and digital rectal examinations were                            normal.                           A 5 mm polyp was found in the cecum. The polyp was                            sessile. The polyp was removed with a cold snare.                            Resection and retrieval were complete. Estimated                            blood loss was minimal.  A 4 mm polyp was found in the transverse colon. The                            polyp was sessile. The polyp was removed with a                            cold snare. Resection and retrieval were complete.                            Estimated blood loss was minimal.                           A 14 mm polyp was found in the rectum. The polyp                            was sessile. The polyp was removed with a hot                            snare. Resection and retrieval were complete.                            Estimated blood loss: none.                           A few small and large-mouthed diverticula were                            found in the sigmoid colon and transverse colon.                           Retroflexion in the right colon was performed.                           The retroflexed view of the distal rectum and anal                            verge was normal and  showed no anal or rectal                            abnormalities.                           There was a medium-sized lipoma, at the splenic                            flexure. Complications:            No immediate complications. Estimated Blood Loss:     Estimated blood loss was minimal. Impression:               - One 5 mm polyp in the cecum, removed with a cold                            snare. Resected and retrieved.                           -  One 4 mm polyp in the transverse colon, removed                            with a cold snare. Resected and retrieved.                           - One 14 mm polyp in the rectum, removed with a hot                            snare. Resected and retrieved.                           - Diverticulosis in the sigmoid colon and in the                            transverse colon.                           - The distal rectum and anal verge are normal on                            retroflexion view.                           - Medium-sized lipoma at the splenic flexure. Recommendation:           - Patient has a contact number available for                            emergencies. The signs and symptoms of potential                            delayed complications were discussed with the                            patient. Return to normal activities tomorrow.                            Written discharge instructions were provided to the                            patient.                           - Resume previous diet today.                           - Continue present medications.                           - Await pathology results.                           - Repeat colonoscopy in 3 years for surveillance  based on pathology results.                           - Return to GI office PRN.                           - Use fiber, for example Citrucel, Fibercon, Konsyl                            or Metamucil. Gerrit Heck,  MD 08/16/2018 12:43:23 PM

## 2018-08-19 ENCOUNTER — Telehealth: Payer: Self-pay | Admitting: *Deleted

## 2018-08-19 NOTE — Telephone Encounter (Signed)
  Follow up Call-  Call back number 08/16/2018  Post procedure Call Back phone  # [phone]  Permission to leave phone message Yes  Some recent data might be hidden     Patient questions:  Do you have a fever, pain , or abdominal swelling? No. Pain Score  0 *  Have you tolerated food without any problems? Yes.    Have you been able to return to your normal activities? Yes.    Do you have any questions about your discharge instructions: Diet   No. Medications  No. Follow up visit  No.  Do you have questions or concerns about your Care? No.  Actions: * If pain score is 4 or above: No action needed, pain <4.  Pt reports that the first day post she had some "light bleeding' and hurt on one side of abdomen but states it has resolved.  Advised to call if she has any recurrent symptoms.

## 2018-08-20 ENCOUNTER — Encounter: Payer: Self-pay | Admitting: Gastroenterology

## 2018-09-06 ENCOUNTER — Other Ambulatory Visit: Payer: Self-pay | Admitting: Internal Medicine

## 2018-09-06 DIAGNOSIS — T50905A Adverse effect of unspecified drugs, medicaments and biological substances, initial encounter: Secondary | ICD-10-CM

## 2018-09-06 DIAGNOSIS — E876 Hypokalemia: Secondary | ICD-10-CM

## 2018-09-06 DIAGNOSIS — I10 Essential (primary) hypertension: Secondary | ICD-10-CM

## 2018-10-09 ENCOUNTER — Encounter: Payer: Self-pay | Admitting: Internal Medicine

## 2018-10-29 ENCOUNTER — Encounter: Payer: Self-pay | Admitting: Internal Medicine

## 2018-10-30 ENCOUNTER — Other Ambulatory Visit (INDEPENDENT_AMBULATORY_CARE_PROVIDER_SITE_OTHER): Payer: BLUE CROSS/BLUE SHIELD

## 2018-10-30 ENCOUNTER — Ambulatory Visit (INDEPENDENT_AMBULATORY_CARE_PROVIDER_SITE_OTHER)
Admission: RE | Admit: 2018-10-30 | Discharge: 2018-10-30 | Disposition: A | Discharge status: Home / Self Care | Payer: BLUE CROSS/BLUE SHIELD | Source: Ambulatory Visit | Attending: Internal Medicine | Admitting: Internal Medicine

## 2018-10-30 ENCOUNTER — Encounter: Payer: Self-pay | Admitting: Internal Medicine

## 2018-10-30 ENCOUNTER — Ambulatory Visit: Payer: BLUE CROSS/BLUE SHIELD | Admitting: Internal Medicine

## 2018-10-30 VITALS — BP 128/90 | HR 58 | Temp 97.8°F | Resp 16 | Ht 63.0 in | Wt 241.2 lb

## 2018-10-30 DIAGNOSIS — R0789 Other chest pain: Secondary | ICD-10-CM | POA: Diagnosis not present

## 2018-10-30 DIAGNOSIS — R109 Unspecified abdominal pain: Secondary | ICD-10-CM

## 2018-10-30 DIAGNOSIS — R0602 Shortness of breath: Secondary | ICD-10-CM | POA: Diagnosis not present

## 2018-10-30 LAB — URINALYSIS, ROUTINE W REFLEX MICROSCOPIC
Bilirubin Urine: NEGATIVE
Hgb urine dipstick: NEGATIVE
Ketones, ur: NEGATIVE
Leukocytes, UA: NEGATIVE
Nitrite: NEGATIVE
RBC / HPF: NONE SEEN (ref 0–?)
Specific Gravity, Urine: 1.02 (ref 1.000–1.030)
Total Protein, Urine: NEGATIVE
Urine Glucose: NEGATIVE
Urobilinogen, UA: 0.2 (ref 0.0–1.0)
pH: 5.5 (ref 5.0–8.0)

## 2018-10-30 LAB — CBC WITH DIFFERENTIAL/PLATELET
Basophils Absolute: 0 10*3/uL (ref 0.0–0.1)
Basophils Relative: 0.5 % (ref 0.0–3.0)
Eosinophils Absolute: 0.1 10*3/uL (ref 0.0–0.7)
Eosinophils Relative: 1.5 % (ref 0.0–5.0)
HCT: 41.5 % (ref 36.0–46.0)
Hemoglobin: 14 g/dL (ref 12.0–15.0)
Lymphocytes Relative: 40.7 % (ref 12.0–46.0)
Lymphs Abs: 1.6 10*3/uL (ref 0.7–4.0)
MCHC: 33.7 g/dL (ref 30.0–36.0)
MCV: 90.8 fl (ref 78.0–100.0)
Monocytes Absolute: 0.4 10*3/uL (ref 0.1–1.0)
Monocytes Relative: 9.3 % (ref 3.0–12.0)
Neutro Abs: 1.9 10*3/uL (ref 1.4–7.7)
Neutrophils Relative %: 48 % (ref 43.0–77.0)
Platelets: 239 10*3/uL (ref 150.0–400.0)
RBC: 4.57 Mil/uL (ref 3.87–5.11)
RDW: 13.9 % (ref 11.5–15.5)
WBC: 4 10*3/uL (ref 4.0–10.5)

## 2018-10-30 LAB — COMPREHENSIVE METABOLIC PANEL
ALT: 12 U/L (ref 0–35)
AST: 14 U/L (ref 0–37)
Albumin: 4.1 g/dL (ref 3.5–5.2)
Alkaline Phosphatase: 75 U/L (ref 39–117)
BUN: 13 mg/dL (ref 6–23)
CO2: 29 mEq/L (ref 19–32)
Calcium: 9.4 mg/dL (ref 8.4–10.5)
Chloride: 106 mEq/L (ref 96–112)
Creatinine, Ser: 0.98 mg/dL (ref 0.40–1.20)
GFR: 75.44 mL/min (ref >60.00)
Glucose, Bld: 85 mg/dL (ref 70–99)
Potassium: 4.2 mEq/L (ref 3.5–5.1)
Sodium: 140 mEq/L (ref 135–145)
Total Bilirubin: 0.4 mg/dL (ref 0.2–1.2)
Total Protein: 7.2 g/dL (ref 6.0–8.3)

## 2018-10-30 LAB — D-DIMER, QUANTITATIVE: D-Dimer, Quant: 0.6 mcg/mL FEU — ABNORMAL HIGH (ref <0.50)

## 2018-10-30 LAB — LIPASE: Lipase: 24 U/L (ref 11.0–59.0)

## 2018-10-30 MED ORDER — MELOXICAM 15 MG PO TABS: 15.0000 mg | ORAL_TABLET | Freq: Every day | ORAL | 0 refills | Status: DC

## 2018-10-30 NOTE — Progress Notes (Signed)
Subjective:  Patient ID: Michelle Barker, female    DOB: 1961/05/31  Age: 57 y.o. MRN: [id number]  CC: Flank Pain   HPI [name] presents for a week history of left posterior flank pain.  She describes a throbbing and a burning sensation that occurs with movement and twisting.  She has not gotten much symptom relief with Tylenol.  She has not noticed a rash.  Outpatient Medications Prior to Visit  Medication Sig Dispense Refill  . estradiol (VIVELLE-DOT) 0.05 MG/24HR patch Place 1 patch (0.05 mg total) onto the skin 2 (two) times a week. 8 patch 2  . progesterone (PROMETRIUM) 100 MG capsule Take 1 capsule (100 mg total) by mouth at bedtime. 30 capsule 2  . spironolactone (ALDACTONE) 25 MG tablet TAKE 1 TABLET BY MOUTH ONCE DAILY 90 tablet 1   No facility-administered medications prior to visit.     ROS Review of Systems  Constitutional: Negative for diaphoresis, fatigue and fever.  HENT: Negative.   Eyes: Negative for visual disturbance.  Respiratory: Negative for cough, chest tightness, shortness of breath and wheezing.   Cardiovascular: Negative for chest pain, palpitations and leg swelling.  Gastrointestinal: Negative for abdominal pain, constipation, diarrhea, nausea and vomiting.  Genitourinary: Positive for flank pain. Negative for decreased urine volume, difficulty urinating, dysuria, hematuria, urgency and vaginal discharge.  Musculoskeletal: Negative for arthralgias, back pain, myalgias and neck pain.  Skin: Negative.  Negative for color change, pallor and rash.  Neurological: Negative.  Negative for dizziness.  Hematological: Negative for adenopathy. Does not bruise/bleed easily.  Psychiatric/Behavioral: Negative.     Objective:  BP 128/90 (BP Location: Left Arm, Patient Position: Sitting, Cuff Size: Large)   Pulse (!) 58   Temp 97.8 F (36.6 C) (Oral)   Resp 16   Ht 5\' 3"  (1.6 m)   Wt 241 lb 4 oz (109.4 kg)   SpO2 99%   BMI 42.74 kg/m   BP Readings from  Last 3 Encounters:  10/30/18 128/90  08/16/18 116/75  07/17/18 130/84    Wt Readings from Last 3 Encounters:  10/30/18 241 lb 4 oz (109.4 kg)  08/02/18 237 lb 6.4 oz (107.7 kg)  07/17/18 237 lb (107.5 kg)    Physical Exam Vitals signs reviewed.  Constitutional:      General: She is not in acute distress.    Appearance: She is normal weight. She is not ill-appearing, toxic-appearing or diaphoretic.  HENT:     Nose: Nose normal.     Mouth/Throat:     Pharynx: No oropharyngeal exudate or posterior oropharyngeal erythema.  Eyes:     Conjunctiva/sclera: Conjunctivae normal.  Neck:     Musculoskeletal: Normal range of motion and neck supple. No muscular tenderness.  Cardiovascular:     Rate and Rhythm: Normal rate and regular rhythm.     Pulses: Normal pulses.     Heart sounds: Normal heart sounds. No murmur. No friction rub. No gallop.   Pulmonary:     Effort: Pulmonary effort is normal.     Breath sounds: Normal breath sounds. No stridor. No wheezing, rhonchi or rales.  Abdominal:     General: Abdomen is flat. Bowel sounds are normal.     Palpations: There is no hepatomegaly, splenomegaly or mass.     Tenderness: There is no abdominal tenderness. There is no guarding or rebound.     Hernia: No hernia is present.  Musculoskeletal: Normal range of motion.        General: No swelling  or tenderness.       Back:  Skin:    General: Skin is warm and dry.     Findings: No erythema or rash.  Neurological:     General: No focal deficit present.     Mental Status: She is oriented to person, place, and time. Mental status is at baseline.     Lab Results  Component Value Date   WBC 4.0 10/30/2018   HGB 14.0 10/30/2018   HCT 41.5 10/30/2018   PLT 239.0 10/30/2018   GLUCOSE 85 10/30/2018   CHOL 193 10/29/2017   TRIG 83.0 10/29/2017   HDL 71.70 10/29/2017   LDLCALC 104 (H) 10/29/2017   ALT 12 10/30/2018   AST 14 10/30/2018   NA 140 10/30/2018   K 4.2 10/30/2018   CL 106  10/30/2018   CREATININE 0.98 10/30/2018   BUN 13 10/30/2018   CO2 29 10/30/2018   TSH 2.24 07/17/2018   HGBA1C 5.8 03/06/2016    Mm Diag Breast Tomo Bilateral  Result Date: 11/21/2017 CLINICAL DATA:  Two year follow-up of right breast calcifications and an upper inner left breast mass. EXAM: 2D DIGITAL DIAGNOSTIC BILATERAL MAMMOGRAM WITH CAD AND ADJUNCT TOMO COMPARISON:  Previous exam(s). ACR Breast Density Category c: The breast tissue is heterogeneously dense, which may obscure small masses. FINDINGS: The loosely grouped calcifications in the right retroareolar region are stable. The mass in the medial superior left breast at a posterior depth containing fat centrally and coarse calcifications peripherally has not significantly changed since March of 2016. No other suspicious findings seen in either breast. Mammographic images were processed with CAD. IMPRESSION: No mammographic evidence of malignancy. RECOMMENDATION: Annual screening mammography. I have discussed the findings and recommendations with the patient. Results were also provided in writing at the conclusion of the visit. If applicable, a reminder letter will be sent to the patient regarding the next appointment. BI-RADS CATEGORY  2: Benign. Electronically Signed   By: Dorise Bullion III M.D   On: 11/21/2017 16:02    No results found.  Assessment & Plan:   Chandi was seen today for flank pain.  Diagnoses and all orders for this visit:  Left flank pain- She has minimal symptomology, normal examination.  Labs were all normal.  Plain films of the area are normal.  I think she has musculoskeletal pain. -     CBC with Differential/Platelet; Future -     Lipase; Future -     Urinalysis, Routine w reflex microscopic; Future -     Comprehensive metabolic panel; Future -     D-dimer, quantitative (not at [facility]); Future -     DG Abd Acute W/Chest; Future  Chest pain, musculoskeletal- Will treat with an anti-inflammatory. -      meloxicam (MOBIC) 15 MG tablet; Take 1 tablet (15 mg total) by mouth daily.   I am having [name] start on meloxicam. I am also having her maintain her estradiol, progesterone, and spironolactone.  Meds ordered this encounter  Medications  . meloxicam (MOBIC) 15 MG tablet    Sig: Take 1 tablet (15 mg total) by mouth daily.    Dispense:  30 tablet    Refill:  0     Follow-up: Return in about 4 weeks (around 11/27/2018).  Scarlette Calico, MD

## 2018-10-30 NOTE — Patient Instructions (Signed)
Flank Pain, Adult  Flank pain is pain that is located on the side of the body between the upper abdomen and the back. This area is called the flank. The pain may occur over a short period of time (acute), or it may be long-term or recurring (chronic). It may be mild or severe. Flank pain can be caused by many things, including:  · Muscle soreness or injury.  · Kidney stones or kidney disease.  · Stress.  · A disease of the spine (vertebral disk disease).  · A lung infection (pneumonia).  · Fluid around the lungs (pulmonary edema).  · A skin rash caused by the chickenpox virus (shingles).  · Tumors that affect the back of the abdomen.  · Gallbladder disease.  Follow these instructions at home:    · Drink enough fluid to keep your urine clear or pale yellow.  · Rest as told by your health care provider.  · Take over-the-counter and prescription medicines only as told by your health care provider.  · Keep a journal to track what has caused your flank pain and what has made it feel better.  · Keep all follow-up visits as told by your health care provider. This is important.  Contact a health care provider if:  · Your pain is not controlled with medicine.  · You have new symptoms.  · Your pain gets worse.  · You have a fever.  · Your symptoms last longer than 2-3 days.  · You have trouble urinating or you are urinating very frequently.  Get help right away if:  · You have trouble breathing or you are short of breath.  · Your abdomen hurts or it is swollen or red.  · You have nausea or vomiting.  · You feel faint or you pass out.  · You have blood in your urine.  Summary  · Flank pain is pain that is located on the side of the body between the upper abdomen and the back.  · The pain may occur over a short period of time (acute), or it may be long-term or recurring (chronic). It may be mild or severe.  · Flank pain can be caused by many things.  · Contact your health care provider if your symptoms get worse or they last  longer than 2-3 days.  This information is not intended to replace advice given to you by your health care provider. Make sure you discuss any questions you have with your health care provider.  Document Released: 12/21/2005 Document Revised: 01/12/2017 Document Reviewed: 01/12/2017  [employer]vier Interactive Patient Education © 2019 [employer]

## 2018-11-11 ENCOUNTER — Encounter: Payer: Self-pay | Admitting: Internal Medicine

## 2018-11-11 ENCOUNTER — Other Ambulatory Visit: Payer: Self-pay | Admitting: Internal Medicine

## 2019-02-17 ENCOUNTER — Other Ambulatory Visit: Payer: Self-pay | Admitting: Internal Medicine

## 2019-02-17 DIAGNOSIS — R0789 Other chest pain: Secondary | ICD-10-CM

## 2019-02-17 DIAGNOSIS — E876 Hypokalemia: Secondary | ICD-10-CM

## 2019-02-17 DIAGNOSIS — T50905A Adverse effect of unspecified drugs, medicaments and biological substances, initial encounter: Secondary | ICD-10-CM

## 2019-02-17 DIAGNOSIS — I10 Essential (primary) hypertension: Secondary | ICD-10-CM

## 2019-02-17 MED ORDER — MELOXICAM 15 MG PO TABS: 15.0000 mg | ORAL_TABLET | Freq: Every day | ORAL | 0 refills | Status: DC

## 2019-02-17 MED ORDER — SPIRONOLACTONE 25 MG PO TABS: 25.0000 mg | ORAL_TABLET | Freq: Every day | ORAL | 0 refills | Status: DC

## 2019-03-13 ENCOUNTER — Encounter: Payer: Self-pay | Admitting: Internal Medicine

## 2019-03-18 ENCOUNTER — Ambulatory Visit (INDEPENDENT_AMBULATORY_CARE_PROVIDER_SITE_OTHER): Payer: BLUE CROSS/BLUE SHIELD | Admitting: Internal Medicine

## 2019-03-18 ENCOUNTER — Other Ambulatory Visit: Payer: Self-pay

## 2019-03-18 ENCOUNTER — Ambulatory Visit (INDEPENDENT_AMBULATORY_CARE_PROVIDER_SITE_OTHER)
Admission: RE | Admit: 2019-03-18 | Discharge: 2019-03-18 | Disposition: A | Discharge status: Home / Self Care | Payer: BLUE CROSS/BLUE SHIELD | Source: Ambulatory Visit | Attending: Internal Medicine | Admitting: Internal Medicine

## 2019-03-18 ENCOUNTER — Encounter: Payer: Self-pay | Admitting: Internal Medicine

## 2019-03-18 VITALS — BP 146/94 | HR 70 | Temp 98.8°F | Resp 16 | Ht 63.0 in | Wt 233.0 lb

## 2019-03-18 DIAGNOSIS — M170 Bilateral primary osteoarthritis of knee: Secondary | ICD-10-CM | POA: Diagnosis not present

## 2019-03-18 DIAGNOSIS — M25562 Pain in left knee: Secondary | ICD-10-CM

## 2019-03-18 DIAGNOSIS — M25561 Pain in right knee: Secondary | ICD-10-CM

## 2019-03-18 DIAGNOSIS — G8929 Other chronic pain: Secondary | ICD-10-CM

## 2019-03-18 DIAGNOSIS — I10 Essential (primary) hypertension: Secondary | ICD-10-CM

## 2019-03-18 DIAGNOSIS — M1711 Unilateral primary osteoarthritis, right knee: Secondary | ICD-10-CM | POA: Diagnosis not present

## 2019-03-18 DIAGNOSIS — M1712 Unilateral primary osteoarthritis, left knee: Secondary | ICD-10-CM | POA: Diagnosis not present

## 2019-03-18 MED ORDER — TRIAMTERENE-HCTZ 37.5-25 MG PO CAPS: 1.0000 | ORAL_CAPSULE | Freq: Every day | ORAL | 0 refills | Status: DC

## 2019-03-18 MED ORDER — METHYLPREDNISOLONE 4 MG PO TBPK: ORAL_TABLET | ORAL | 0 refills | Status: AC

## 2019-03-18 NOTE — Patient Instructions (Signed)

## 2019-03-18 NOTE — Progress Notes (Signed)
Subjective:  Patient ID: Michelle Barker, female    DOB: 1961/07/02  Age: 58 y.o. MRN: [id number]  CC: Knee Pain (Bilateral, Left > Right. Left knee is "popping"); Osteoarthritis; and Hypertension   HPI Michelle Barker presents for concerns about her knees.  She complains of a 1 to 69-month history of worsening, bilateral knee pain and mild swelling.  The pain is worse on the left than the right but the right knee feels more unstable than the left knee.  None of her other joints bother her.  She has a prescription for meloxicam but has not recently been taking it.  The pain is exacerbated by climbing stairs.  She also complains that her blood pressure has not been well controlled and that spironolactone has not helped reduce any of the fluid from her legs.  Outpatient Medications Prior to Visit  Medication Sig Dispense Refill   estradiol (VIVELLE-DOT) 0.05 MG/24HR patch Place 1 patch (0.05 mg total) onto the skin 2 (two) times a week. 8 patch 2   meloxicam (MOBIC) 15 MG tablet Take 1 tablet (15 mg total) by mouth daily. 90 tablet 0   progesterone (PROMETRIUM) 100 MG capsule Take 1 capsule (100 mg total) by mouth at bedtime. 30 capsule 2   spironolactone (ALDACTONE) 25 MG tablet Take 1 tablet (25 mg total) by mouth daily. 90 tablet 0   No facility-administered medications prior to visit.     ROS Review of Systems  Constitutional: Negative for chills, fatigue, fever and unexpected weight change.  HENT: Negative.   Eyes: Negative for visual disturbance.  Respiratory: Negative for cough, chest tightness, shortness of breath and wheezing.   Cardiovascular: Positive for leg swelling. Negative for chest pain and palpitations.  Gastrointestinal: Negative for abdominal pain, constipation, diarrhea, nausea and vomiting.  Genitourinary: Negative.  Negative for difficulty urinating and dysuria.  Musculoskeletal: Positive for arthralgias. Negative for back pain, myalgias and neck pain.  Skin:  Negative.   Neurological: Negative.  Negative for dizziness, weakness, light-headedness and headaches.  Hematological: Negative for adenopathy. Does not bruise/bleed easily.  Psychiatric/Behavioral: Negative.     Objective:  BP (!) 146/94 (BP Location: Left Arm, Patient Position: Sitting, Cuff Size: Large)    Pulse 70    Temp 98.8 F (37.1 C) (Oral)    Resp 16    Ht 5\' 3"  (1.6 m)    Wt 233 lb (105.7 kg)    SpO2 98%    BMI 41.27 kg/m   BP Readings from Last 3 Encounters:  03/18/19 (!) 146/94  10/30/18 128/90  08/16/18 116/75    Wt Readings from Last 3 Encounters:  03/18/19 233 lb (105.7 kg)  10/30/18 241 lb 4 oz (109.4 kg)  08/02/18 237 lb 6.4 oz (107.7 kg)    Physical Exam Vitals signs reviewed.  Constitutional:      Appearance: She is obese. She is not ill-appearing or diaphoretic.  HENT:     Nose: Nose normal. No congestion.     Mouth/Throat:     Mouth: Mucous membranes are moist.     Pharynx: No oropharyngeal exudate.  Eyes:     Conjunctiva/sclera: Conjunctivae normal.  Neck:     Musculoskeletal: Normal range of motion and neck supple.  Cardiovascular:     Rate and Rhythm: Normal rate and regular rhythm.     Pulses: Normal pulses.     Heart sounds: No murmur. No gallop.   Pulmonary:     Effort: Pulmonary effort is normal. No respiratory distress.  Breath sounds: No stridor. No wheezing or rales.  Abdominal:     General: Bowel sounds are normal.     Palpations: There is no mass.     Tenderness: There is no abdominal tenderness. There is no guarding.  Musculoskeletal: Normal range of motion.        General: No swelling.     Right lower leg: Edema (trace pitting) present.     Left lower leg: Edema (trace pitting) present.  Lymphadenopathy:     Cervical: No cervical adenopathy.  Skin:    General: Skin is warm and dry.  Neurological:     General: No focal deficit present.  Psychiatric:        Mood and Affect: Mood normal.        Behavior: Behavior normal.       Lab Results  Component Value Date   WBC 4.0 10/30/2018   HGB 14.0 10/30/2018   HCT 41.5 10/30/2018   PLT 239.0 10/30/2018   GLUCOSE 85 10/30/2018   CHOL 193 10/29/2017   TRIG 83.0 10/29/2017   HDL 71.70 10/29/2017   LDLCALC 104 (H) 10/29/2017   ALT 12 10/30/2018   AST 14 10/30/2018   NA 140 10/30/2018   K 4.2 10/30/2018   CL 106 10/30/2018   CREATININE 0.98 10/30/2018   BUN 13 10/30/2018   CO2 29 10/30/2018   TSH 2.24 07/17/2018   HGBA1C 5.8 03/06/2016    Dg Abd Acute W/chest  Result Date: 10/30/2018 CLINICAL DATA:  58 year old female with posterior upper abdomen, shortness of breath, left flank pain. EXAM: DG ABDOMEN ACUTE W/ 1V CHEST COMPARISON:  Abdominal radiographs 03/07/2011. FINDINGS: Cardiac size at the upper limits of normal. Other mediastinal contours are within normal limits. Visualized tracheal air column is within normal limits. Both lungs appear clear. No pneumothorax, pneumoperitoneum or pleural effusion. Non obstructed bowel gas pattern. Negative abdominal visceral contours. Chronic pelvic phleboliths. No acute osseous abnormality identified. IMPRESSION: 1. Normal bowel gas pattern, no free air. 2. No acute cardiopulmonary abnormality. Electronically Signed   By: Genevie Ann M.D.   On: 10/30/2018 12:14   Dg Knee Complete 4 Views Left  Result Date: 03/18/2019 CLINICAL DATA:  Knee pain EXAM: LEFT KNEE - COMPLETE 4+ VIEW COMPARISON:  None. FINDINGS: No fracture or malalignment. Mild patellofemoral and medial joint space degenerative change. Trace knee effusion IMPRESSION: Mild degenerative changes with trace knee effusion. No acute osseous abnormality Electronically Signed   By: Donavan Foil M.D.   On: 03/18/2019 17:59   Dg Knee Complete 4 Views Right  Result Date: 03/18/2019 CLINICAL DATA:  Knee pain EXAM: RIGHT KNEE - COMPLETE 4+ VIEW COMPARISON:  None. FINDINGS: No fracture or malalignment. Mild patellofemoral and medial joint space degenerative change. Trace knee  effusion. IMPRESSION: Mild degenerative changes with trace knee effusion. No acute osseous abnormality Electronically Signed   By: Donavan Foil M.D.   On: 03/18/2019 18:00     Assessment & Plan:   Onedia was seen today for knee pain, osteoarthritis and hypertension.  Diagnoses and all orders for this visit:  Essential hypertension- Her blood pressure is not adequately well controlled.  I have asked her to stop taking spironolactone and to upgrade to the combination of triamterene and hydrochlorothiazide. -     triamterene-hydrochlorothiazide (DYAZIDE) 37.5-25 MG capsule; Take 1 each (1 capsule total) by mouth daily.  Chronic pain of both knees- See below -     DG Knee Complete 4 Views Left; Future -  DG Knee Complete 4 Views Right; Future  Primary osteoarthritis of both knees- Plain films show mild DJD with trace effusion.  I think she would benefit from taking a 6-day course of methylprednisolone.  I have also asked her to take the meloxicam more consistently. -     DG Knee Complete 4 Views Left; Future -     DG Knee Complete 4 Views Right; Future -     methylPREDNISolone (MEDROL DOSEPAK) 4 MG TBPK tablet; TAKE AS DIRECTED   I have discontinued Kailoni Hack's spironolactone. I am also having her start on triamterene-hydrochlorothiazide and methylPREDNISolone. Additionally, I am having her maintain her estradiol, progesterone, and meloxicam.  Meds ordered this encounter  Medications   triamterene-hydrochlorothiazide (DYAZIDE) 37.5-25 MG capsule    Sig: Take 1 each (1 capsule total) by mouth daily.    Dispense:  90 capsule    Refill:  0   methylPREDNISolone (MEDROL DOSEPAK) 4 MG TBPK tablet    Sig: TAKE AS DIRECTED    Dispense:  21 tablet    Refill:  0     Follow-up: Return in about 3 months (around 06/18/2019).  Scarlette Calico, MD

## 2019-03-19 ENCOUNTER — Encounter: Payer: Self-pay | Admitting: Internal Medicine

## 2019-03-20 ENCOUNTER — Encounter: Payer: Self-pay | Admitting: Internal Medicine

## 2019-03-20 NOTE — Telephone Encounter (Signed)
Spoke to patient and she confirmed that the pharmacy needed to speak to [place]. Pharmacy contacted and they stated that there was a drug interaction between the spironolactone and the dose pak. Informed that the spironolactone was dc'ed.   Pharmacy will send medication to patient.

## 2019-04-10 DIAGNOSIS — T1512XA Foreign body in conjunctival sac, left eye, initial encounter: Secondary | ICD-10-CM | POA: Diagnosis not present

## 2019-06-20 ENCOUNTER — Other Ambulatory Visit: Payer: Self-pay | Admitting: Internal Medicine

## 2019-06-20 DIAGNOSIS — I10 Essential (primary) hypertension: Secondary | ICD-10-CM

## 2019-07-24 ENCOUNTER — Other Ambulatory Visit: Payer: Self-pay | Admitting: Internal Medicine

## 2019-07-24 ENCOUNTER — Encounter: Payer: Self-pay | Admitting: Internal Medicine

## 2019-07-24 DIAGNOSIS — I10 Essential (primary) hypertension: Secondary | ICD-10-CM

## 2019-07-24 MED ORDER — TRIAMTERENE-HCTZ 37.5-25 MG PO CAPS: 1.0000 | ORAL_CAPSULE | Freq: Every day | ORAL | 0 refills | Status: DC

## 2019-07-31 ENCOUNTER — Ambulatory Visit (INDEPENDENT_AMBULATORY_CARE_PROVIDER_SITE_OTHER): Payer: BC Managed Care – PPO | Admitting: Internal Medicine

## 2019-07-31 ENCOUNTER — Other Ambulatory Visit (INDEPENDENT_AMBULATORY_CARE_PROVIDER_SITE_OTHER): Payer: BC Managed Care – PPO

## 2019-07-31 ENCOUNTER — Encounter: Payer: Self-pay | Admitting: Internal Medicine

## 2019-07-31 ENCOUNTER — Other Ambulatory Visit: Payer: Self-pay

## 2019-07-31 VITALS — BP 138/86 | HR 85 | Temp 98.5°F | Resp 16 | Ht 63.0 in | Wt 237.8 lb

## 2019-07-31 DIAGNOSIS — T50905A Adverse effect of unspecified drugs, medicaments and biological substances, initial encounter: Secondary | ICD-10-CM | POA: Diagnosis not present

## 2019-07-31 DIAGNOSIS — I10 Essential (primary) hypertension: Secondary | ICD-10-CM

## 2019-07-31 DIAGNOSIS — Z0000 Encounter for general adult medical examination without abnormal findings: Secondary | ICD-10-CM | POA: Diagnosis not present

## 2019-07-31 DIAGNOSIS — E876 Hypokalemia: Secondary | ICD-10-CM | POA: Diagnosis not present

## 2019-07-31 LAB — CBC WITH DIFFERENTIAL/PLATELET
Basophils Absolute: 0 10*3/uL (ref 0.0–0.1)
Basophils Relative: 0.6 % (ref 0.0–3.0)
Eosinophils Absolute: 0.1 10*3/uL (ref 0.0–0.7)
Eosinophils Relative: 1.2 % (ref 0.0–5.0)
HCT: 43.6 % (ref 36.0–46.0)
Hemoglobin: 14.7 g/dL (ref 12.0–15.0)
Lymphocytes Relative: 38.2 % (ref 12.0–46.0)
Lymphs Abs: 1.9 10*3/uL (ref 0.7–4.0)
MCHC: 33.8 g/dL (ref 30.0–36.0)
MCV: 88.4 fl (ref 78.0–100.0)
Monocytes Absolute: 0.5 10*3/uL (ref 0.1–1.0)
Monocytes Relative: 9.4 % (ref 3.0–12.0)
Neutro Abs: 2.5 10*3/uL (ref 1.4–7.7)
Neutrophils Relative %: 50.6 % (ref 43.0–77.0)
Platelets: 288 10*3/uL (ref 150.0–400.0)
RBC: 4.93 Mil/uL (ref 3.87–5.11)
RDW: 14.2 % (ref 11.5–15.5)
WBC: 4.9 10*3/uL (ref 4.0–10.5)

## 2019-07-31 LAB — BASIC METABOLIC PANEL
BUN: 19 mg/dL (ref 6–23)
CO2: 33 mEq/L — ABNORMAL HIGH (ref 19–32)
Calcium: 10.2 mg/dL (ref 8.4–10.5)
Chloride: 96 mEq/L (ref 96–112)
Creatinine, Ser: 1.16 mg/dL (ref 0.40–1.20)
GFR: 58.27 mL/min — ABNORMAL LOW (ref >60.00)
Glucose, Bld: 84 mg/dL (ref 70–99)
Potassium: 3.4 mEq/L — ABNORMAL LOW (ref 3.5–5.1)
Sodium: 136 mEq/L (ref 135–145)

## 2019-07-31 LAB — LIPID PANEL
Cholesterol: 229 mg/dL — ABNORMAL HIGH (ref 0–200)
HDL: 86.8 mg/dL (ref >39.00)
LDL Cholesterol: 117 mg/dL — ABNORMAL HIGH (ref 0–99)
NonHDL: 141.9
Total CHOL/HDL Ratio: 3
Triglycerides: 126 mg/dL (ref 0.0–149.0)
VLDL: 25.2 mg/dL (ref 0.0–40.0)

## 2019-07-31 MED ORDER — POTASSIUM CHLORIDE CRYS ER 20 MEQ PO TBCR: 20.0000 meq | EXTENDED_RELEASE_TABLET | Freq: Three times a day (TID) | ORAL | 1 refills | Status: DC

## 2019-07-31 NOTE — Patient Instructions (Signed)

## 2019-07-31 NOTE — Progress Notes (Signed)
Subjective:  Patient ID: Michelle Barker, female    DOB: [birth year]  Age: 58 y.o. MRN: [id number]  CC: Hypertension and Annual Exam   HPI [name] presents for a CPX.  She tells me that her blood pressure has been well controlled.  She has been exercising quite a bit and denies any recent episodes of CP, DOE, palpitations, edema, or fatigue.  Outpatient Medications Prior to Visit  Medication Sig Dispense Refill  . estradiol (VIVELLE-DOT) 0.05 MG/24HR patch Place 1 patch (0.05 mg total) onto the skin 2 (two) times a week. 8 patch 2  . meloxicam (MOBIC) 15 MG tablet Take 1 tablet (15 mg total) by mouth daily. 90 tablet 0  . progesterone (PROMETRIUM) 100 MG capsule Take 1 capsule (100 mg total) by mouth at bedtime. 30 capsule 2  . triamterene-hydrochlorothiazide (DYAZIDE) 37.5-25 MG capsule Take 1 each (1 capsule total) by mouth daily. Follow-up appt is due must see provider for future refills 30 capsule 0   No facility-administered medications prior to visit.     ROS Review of Systems  Constitutional: Negative for appetite change, diaphoresis, fatigue and unexpected weight change.  HENT: Negative.   Eyes: Negative for visual disturbance.  Respiratory: Negative for cough, chest tightness, shortness of breath and wheezing.   Cardiovascular: Negative for chest pain, palpitations and leg swelling.  Gastrointestinal: Negative for abdominal pain, constipation, diarrhea, nausea and vomiting.  Endocrine: Negative.   Genitourinary: Negative.  Negative for difficulty urinating.  Musculoskeletal: Negative for arthralgias and myalgias.  Skin: Negative.  Negative for color change and pallor.  Neurological: Negative for dizziness, weakness and light-headedness.  Hematological: Negative for adenopathy. Does not bruise/bleed easily.  Psychiatric/Behavioral: Negative.     Objective:  BP 138/86 (BP Location: Left Arm, Patient Position: Sitting, Cuff Size: Large)   Pulse 85   Temp 98.5 F  (36.9 C) (Oral)   Resp 16   Ht 5\' 3"  (1.6 m)   Wt 237 lb 12 oz (107.8 kg)   SpO2 99%   BMI 42.12 kg/m   BP Readings from Last 3 Encounters:  07/31/19 138/86  03/18/19 (!) 146/94  10/30/18 128/90    Wt Readings from Last 3 Encounters:  07/31/19 237 lb 12 oz (107.8 kg)  03/18/19 233 lb (105.7 kg)  10/30/18 241 lb 4 oz (109.4 kg)    Physical Exam Vitals signs reviewed.  Constitutional:      Appearance: She is obese. She is not ill-appearing or diaphoretic.  HENT:     Nose: Nose normal.     Mouth/Throat:     Mouth: Mucous membranes are moist.     Pharynx: No oropharyngeal exudate.  Eyes:     General: No scleral icterus.    Conjunctiva/sclera: Conjunctivae normal.  Neck:     Musculoskeletal: Normal range of motion and neck supple.  Cardiovascular:     Rate and Rhythm: Normal rate and regular rhythm.     Heart sounds: No murmur.  Pulmonary:     Effort: Pulmonary effort is normal.     Breath sounds: No stridor. No wheezing, rhonchi or rales.  Abdominal:     General: Abdomen is protuberant. Bowel sounds are normal. There is no distension.     Palpations: Abdomen is soft. There is no hepatomegaly or splenomegaly.     Tenderness: There is no abdominal tenderness.  Musculoskeletal: Normal range of motion.     Right lower leg: No edema.     Left lower leg: No edema.  Lymphadenopathy:  Cervical: No cervical adenopathy.  Skin:    General: Skin is warm and dry.     Coloration: Skin is not pale.  Neurological:     General: No focal deficit present.     Mental Status: She is alert.  Psychiatric:        Mood and Affect: Mood normal.     Lab Results  Component Value Date   WBC 4.9 07/31/2019   HGB 14.7 07/31/2019   HCT 43.6 07/31/2019   PLT 288.0 07/31/2019   GLUCOSE 84 07/31/2019   CHOL 229 (H) 07/31/2019   TRIG 126.0 07/31/2019   HDL 86.80 07/31/2019   LDLCALC 117 (H) 07/31/2019   ALT 12 10/30/2018   AST 14 10/30/2018   NA 136 07/31/2019   K 3.4 (L)  07/31/2019   CL 96 07/31/2019   CREATININE 1.16 07/31/2019   BUN 19 07/31/2019   CO2 33 (H) 07/31/2019   TSH 2.24 07/17/2018   HGBA1C 5.8 03/06/2016    Dg Knee Complete 4 Views Left  Result Date: 03/18/2019 CLINICAL DATA:  Knee pain EXAM: LEFT KNEE - COMPLETE 4+ VIEW COMPARISON:  None. FINDINGS: No fracture or malalignment. Mild patellofemoral and medial joint space degenerative change. Trace knee effusion IMPRESSION: Mild degenerative changes with trace knee effusion. No acute osseous abnormality Electronically Signed   By: Donavan Foil M.D.   On: 03/18/2019 17:59   Dg Knee Complete 4 Views Right  Result Date: 03/18/2019 CLINICAL DATA:  Knee pain EXAM: RIGHT KNEE - COMPLETE 4+ VIEW COMPARISON:  None. FINDINGS: No fracture or malalignment. Mild patellofemoral and medial joint space degenerative change. Trace knee effusion. IMPRESSION: Mild degenerative changes with trace knee effusion. No acute osseous abnormality Electronically Signed   By: Donavan Foil M.D.   On: 03/18/2019 18:00    Assessment & Plan:   [name] was seen today for hypertension and annual exam.  Diagnoses and all orders for this visit:  Essential hypertension- Her blood pressure is adequately well controlled.  She has developed hypokalemia.  Her renal function is normal.  I have asked her to stay on the current dose of Dyazide. -     CBC with Differential/Platelet; Future -     Basic metabolic panel; Future -     potassium chloride SA (K-DUR) 20 MEQ tablet; Take 1 tablet (20 mEq total) by mouth 3 (three) times daily.  Routine general medical examination at a health care facility- Exam completed, labs reviewed-she has a low ASCVD risk score so I did not recommend a statin for CV risk reduction, Pap/mammogram/colon cancer screening are all up-to-date, patient education was given. -     Lipid panel; Future  Drug-induced hypokalemia -     potassium chloride SA (K-DUR) 20 MEQ tablet; Take 1 tablet (20 mEq total) by mouth 3  (three) times daily.   I am having [name] start on potassium chloride SA. I am also having her maintain her estradiol, progesterone, meloxicam, and triamterene-hydrochlorothiazide.  Meds ordered this encounter  Medications  . potassium chloride SA (K-DUR) 20 MEQ tablet    Sig: Take 1 tablet (20 mEq total) by mouth 3 (three) times daily.    Dispense:  90 tablet    Refill:  1     Follow-up: Return in about 6 months (around 01/28/2020).  Scarlette Calico, MD

## 2019-08-01 ENCOUNTER — Encounter: Payer: Self-pay | Admitting: Internal Medicine

## 2019-08-04 ENCOUNTER — Telehealth: Payer: Self-pay | Admitting: *Deleted

## 2019-08-04 NOTE — Telephone Encounter (Signed)
Copied from [place] [id number]. Topic: General - Other >> Aug 01, 2019  3:26 PM Yvette Rack wrote: Reason for CRM: Jessica with [facility] called to get approval to fill Rx for potassium chloride SA (K-DUR) 20 MEQ tablet due to drug interaction with Rx for triamterene-hydrochlorothiazide (DYAZIDE) 37.5-25 MG capsule. Cb# [phone]

## 2019-08-12 ENCOUNTER — Encounter: Payer: Self-pay | Admitting: Gynecology

## 2019-08-12 ENCOUNTER — Encounter: Payer: Self-pay | Admitting: Internal Medicine

## 2019-08-12 NOTE — Telephone Encounter (Signed)
Per PCP mychart message to pt - okay to dispense.   Pharmacy has been contacted.

## 2019-08-18 ENCOUNTER — Other Ambulatory Visit: Payer: Self-pay | Admitting: Internal Medicine

## 2019-08-18 DIAGNOSIS — I10 Essential (primary) hypertension: Secondary | ICD-10-CM

## 2019-11-04 ENCOUNTER — Encounter: Payer: Self-pay | Admitting: Internal Medicine

## 2020-01-13 ENCOUNTER — Encounter: Payer: Self-pay | Admitting: Internal Medicine

## 2020-01-19 ENCOUNTER — Encounter: Payer: Self-pay | Admitting: Internal Medicine

## 2020-03-05 ENCOUNTER — Other Ambulatory Visit: Payer: Self-pay | Admitting: Internal Medicine

## 2020-03-05 DIAGNOSIS — I10 Essential (primary) hypertension: Secondary | ICD-10-CM

## 2020-03-08 ENCOUNTER — Encounter: Payer: Self-pay | Admitting: Internal Medicine

## 2020-03-08 NOTE — Telephone Encounter (Signed)
    Patient requesting to speak with CMA to discuss BP medication. Patient wants medication called in today. Appointment scheduled for 4/27

## 2020-03-09 ENCOUNTER — Encounter: Payer: Self-pay | Admitting: Internal Medicine

## 2020-03-09 ENCOUNTER — Other Ambulatory Visit: Payer: Self-pay | Admitting: Obstetrics & Gynecology

## 2020-03-09 ENCOUNTER — Ambulatory Visit: Payer: BC Managed Care – PPO | Admitting: Internal Medicine

## 2020-03-09 ENCOUNTER — Other Ambulatory Visit: Payer: Self-pay

## 2020-03-09 VITALS — BP 128/70 | HR 68 | Temp 98.4°F | Resp 16 | Ht 63.0 in | Wt 245.0 lb

## 2020-03-09 DIAGNOSIS — E876 Hypokalemia: Secondary | ICD-10-CM | POA: Diagnosis not present

## 2020-03-09 DIAGNOSIS — R600 Localized edema: Secondary | ICD-10-CM | POA: Diagnosis not present

## 2020-03-09 DIAGNOSIS — Z1231 Encounter for screening mammogram for malignant neoplasm of breast: Secondary | ICD-10-CM

## 2020-03-09 DIAGNOSIS — T50905A Adverse effect of unspecified drugs, medicaments and biological substances, initial encounter: Secondary | ICD-10-CM | POA: Diagnosis not present

## 2020-03-09 DIAGNOSIS — I10 Essential (primary) hypertension: Secondary | ICD-10-CM | POA: Diagnosis not present

## 2020-03-09 MED ORDER — TRIAMTERENE-HCTZ 37.5-25 MG PO CAPS: ORAL_CAPSULE | ORAL | 1 refills | Status: DC

## 2020-03-09 NOTE — Patient Instructions (Signed)

## 2020-03-09 NOTE — Telephone Encounter (Signed)
Patient seen in office today. 

## 2020-03-09 NOTE — Progress Notes (Signed)
Subjective:  Patient ID: Michelle Barker, female    DOB: 1961/08/08  Age: 59 y.o. MRN: [id number]  CC: Hypertension  This visit occurred during the SARS-CoV-2 public health emergency.  Safety protocols were in place, including screening questions prior to the visit, additional usage of staff PPE, and extensive cleaning of exam room while observing appropriate contact time as indicated for disinfecting solutions.    HPI [name] presents for f/up - She has not been working much on her lifestyle modifications and has gained weight.  She tells me that when she is active she does not experience CP, DOE, palpitations, or fatigue.  She has persistent, unchanged lower extremity edema that is not painful.  Outpatient Medications Prior to Visit  Medication Sig Dispense Refill  . estradiol (VIVELLE-DOT) 0.05 MG/24HR patch Place 1 patch (0.05 mg total) onto the skin 2 (two) times a week. 8 patch 2  . meloxicam (MOBIC) 15 MG tablet Take 1 tablet (15 mg total) by mouth daily. 90 tablet 0  . potassium chloride SA (K-DUR) 20 MEQ tablet Take 1 tablet (20 mEq total) by mouth 3 (three) times daily. 90 tablet 1  . progesterone (PROMETRIUM) 100 MG capsule Take 1 capsule (100 mg total) by mouth at bedtime. 30 capsule 2  . triamterene-hydrochlorothiazide (DYAZIDE) 37.5-25 MG capsule TAKE 1 CAPSULE BY MOUTH ONCE DAILY .  FOLLOW  UP  APPOINTMENT  IS  DUE.  MUST  SEE  PROVIDER  FOR  FUTURE  REFILLS 90 capsule 1   No facility-administered medications prior to visit.    ROS Review of Systems  Constitutional: Positive for unexpected weight change (wt gain). Negative for appetite change, diaphoresis and fatigue.  Eyes: Negative for visual disturbance.  Respiratory: Negative for apnea, cough, chest tightness and wheezing.   Cardiovascular: Positive for leg swelling. Negative for chest pain and palpitations.  Gastrointestinal: Negative.  Negative for abdominal pain and constipation.  Endocrine: Negative for  polyuria.  Genitourinary: Negative.  Negative for difficulty urinating.  Musculoskeletal: Negative.  Negative for arthralgias and myalgias.  Skin: Negative.   Neurological: Negative.  Negative for dizziness, weakness and light-headedness.  Hematological: Negative for adenopathy. Does not bruise/bleed easily.  Psychiatric/Behavioral: Negative.     Objective:  BP 128/70 (BP Location: Left Arm, Patient Position: Sitting, Cuff Size: Large)   Pulse 68   Temp 98.4 F (36.9 C) (Oral)   Resp 16   Ht 5\' 3"  (1.6 m)   Wt 245 lb (111.1 kg)   SpO2 97%   BMI 43.40 kg/m   BP Readings from Last 3 Encounters:  03/09/20 128/70  07/31/19 138/86  03/18/19 (!) 146/94    Wt Readings from Last 3 Encounters:  03/09/20 245 lb (111.1 kg)  07/31/19 237 lb 12 oz (107.8 kg)  03/18/19 233 lb (105.7 kg)    Physical Exam Vitals reviewed.  Constitutional:      Appearance: She is obese.  HENT:     Nose: Nose normal.  Eyes:     General: No scleral icterus.    Conjunctiva/sclera: Conjunctivae normal.  Cardiovascular:     Rate and Rhythm: Normal rate and regular rhythm.     Comments: EKG --- NSR with sinus arrhythmia, 74 bpm No LVH or Q waves Flat T waves inf/lat - no change from the prior EKG Pulmonary:     Effort: Pulmonary effort is normal.     Breath sounds: No stridor. No wheezing, rhonchi or rales.  Abdominal:     General: Abdomen is protuberant.  Bowel sounds are normal. There is no distension.     Palpations: Abdomen is soft. There is no hepatomegaly, splenomegaly or mass.     Tenderness: There is no abdominal tenderness.  Musculoskeletal:     Cervical back: Neck supple.     Right lower leg: 2+ Pitting Edema present.     Left lower leg: 2+ Pitting Edema present.  Lymphadenopathy:     Cervical: No cervical adenopathy.  Skin:    General: Skin is warm and dry.  Neurological:     General: No focal deficit present.     Mental Status: She is alert.  Psychiatric:        Mood and Affect:  Mood normal.        Behavior: Behavior normal.     Lab Results  Component Value Date   WBC 4.9 07/31/2019   HGB 14.7 07/31/2019   HCT 43.6 07/31/2019   PLT 288.0 07/31/2019   GLUCOSE 79 03/09/2020   CHOL 229 (H) 07/31/2019   TRIG 126.0 07/31/2019   HDL 86.80 07/31/2019   LDLCALC 117 (H) 07/31/2019   ALT 12 10/30/2018   AST 14 10/30/2018   NA 140 03/09/2020   K 3.4 (L) 03/09/2020   CL 105 03/09/2020   CREATININE 1.02 03/09/2020   BUN 15 03/09/2020   CO2 27 03/09/2020   TSH 2.06 03/09/2020   HGBA1C 5.8 03/06/2016    DG Knee Complete 4 Views Left  Result Date: 03/18/2019 CLINICAL DATA:  Knee pain EXAM: LEFT KNEE - COMPLETE 4+ VIEW COMPARISON:  None. FINDINGS: No fracture or malalignment. Mild patellofemoral and medial joint space degenerative change. Trace knee effusion IMPRESSION: Mild degenerative changes with trace knee effusion. No acute osseous abnormality Electronically Signed   By: Donavan Foil M.D.   On: 03/18/2019 17:59   DG Knee Complete 4 Views Right  Result Date: 03/18/2019 CLINICAL DATA:  Knee pain EXAM: RIGHT KNEE - COMPLETE 4+ VIEW COMPARISON:  None. FINDINGS: No fracture or malalignment. Mild patellofemoral and medial joint space degenerative change. Trace knee effusion. IMPRESSION: Mild degenerative changes with trace knee effusion. No acute osseous abnormality Electronically Signed   By: Donavan Foil M.D.   On: 03/18/2019 18:00    Assessment & Plan:   Kimm was seen today for hypertension.  Diagnoses and all orders for this visit:  Essential hypertension- Her BP is well controlled. -     Magnesium; Future -     Basic metabolic panel; Future -     TSH; Future -     EKG 12-Lead -     triamterene-hydrochlorothiazide (DYAZIDE) 37.5-25 MG capsule; TAKE 1 CAPSULE BY MOUTH ONCE DAILY . -     TSH -     Basic metabolic panel -     Magnesium  Drug-induced hypokalemia- Her K+ level is mildly low. She will increase her intake of K+. -     Magnesium; Future -      Basic metabolic panel; Future -     triamterene-hydrochlorothiazide (DYAZIDE) 37.5-25 MG capsule; TAKE 1 CAPSULE BY MOUTH ONCE DAILY . -     Basic metabolic panel -     Magnesium  Bilateral leg edema- Work up is negative for secondary causes. I have asked her to improve her lifestyle modifications. -     EKG 12-Lead -     triamterene-hydrochlorothiazide (DYAZIDE) 37.5-25 MG capsule; TAKE 1 CAPSULE BY MOUTH ONCE DAILY .   I have changed Jana Half Alejandro's triamterene-hydrochlorothiazide. I am also having her maintain  her estradiol, progesterone, meloxicam, and potassium chloride SA.  Meds ordered this encounter  Medications  . triamterene-hydrochlorothiazide (DYAZIDE) 37.5-25 MG capsule    Sig: TAKE 1 CAPSULE BY MOUTH ONCE DAILY .    Dispense:  90 capsule    Refill:  1     Follow-up: Return in about 6 months (around 09/08/2020).  Scarlette Calico, MD

## 2020-03-10 ENCOUNTER — Encounter: Payer: Self-pay | Admitting: Internal Medicine

## 2020-03-10 LAB — BASIC METABOLIC PANEL
BUN: 15 mg/dL (ref 6–23)
CO2: 27 mEq/L (ref 19–32)
Calcium: 9.1 mg/dL (ref 8.4–10.5)
Chloride: 105 mEq/L (ref 96–112)
Creatinine, Ser: 1.02 mg/dL (ref 0.40–1.20)
GFR: 67.45 mL/min (ref >60.00)
Glucose, Bld: 79 mg/dL (ref 70–99)
Potassium: 3.4 mEq/L — ABNORMAL LOW (ref 3.5–5.1)
Sodium: 140 mEq/L (ref 135–145)

## 2020-03-10 LAB — TSH: TSH: 2.06 u[IU]/mL (ref 0.35–4.50)

## 2020-03-10 LAB — MAGNESIUM: Magnesium: 1.7 mg/dL (ref 1.5–2.5)

## 2020-03-19 ENCOUNTER — Other Ambulatory Visit: Payer: Self-pay

## 2020-03-19 ENCOUNTER — Ambulatory Visit
Admission: RE | Admit: 2020-03-19 | Discharge: 2020-03-19 | Disposition: A | Discharge status: Home / Self Care | Payer: BC Managed Care – PPO | Source: Ambulatory Visit

## 2020-03-19 DIAGNOSIS — Z1231 Encounter for screening mammogram for malignant neoplasm of breast: Secondary | ICD-10-CM

## 2020-07-16 DIAGNOSIS — H11441 Conjunctival cysts, right eye: Secondary | ICD-10-CM | POA: Diagnosis not present

## 2020-09-07 LAB — HM MAMMOGRAPHY: HM Mammogram: NORMAL (ref 0–4)

## 2020-09-07 LAB — HM PAP SMEAR

## 2020-09-09 DIAGNOSIS — Z6841 Body Mass Index (BMI) 40.0 and over, adult: Secondary | ICD-10-CM | POA: Diagnosis not present

## 2020-09-09 DIAGNOSIS — Z01419 Encounter for gynecological examination (general) (routine) without abnormal findings: Secondary | ICD-10-CM | POA: Diagnosis not present

## 2020-09-09 DIAGNOSIS — A6000 Herpesviral infection of urogenital system, unspecified: Secondary | ICD-10-CM | POA: Insufficient documentation

## 2020-09-14 ENCOUNTER — Encounter: Payer: Self-pay | Admitting: Internal Medicine

## 2020-09-14 ENCOUNTER — Other Ambulatory Visit: Payer: Self-pay

## 2020-09-14 ENCOUNTER — Ambulatory Visit (INDEPENDENT_AMBULATORY_CARE_PROVIDER_SITE_OTHER): Payer: BC Managed Care – PPO | Admitting: Internal Medicine

## 2020-09-14 VITALS — BP 132/74 | HR 80 | Temp 98.2°F | Resp 16 | Ht 63.0 in | Wt 230.0 lb

## 2020-09-14 DIAGNOSIS — I10 Essential (primary) hypertension: Secondary | ICD-10-CM

## 2020-09-14 DIAGNOSIS — Z0000 Encounter for general adult medical examination without abnormal findings: Secondary | ICD-10-CM | POA: Diagnosis not present

## 2020-09-14 DIAGNOSIS — M25561 Pain in right knee: Secondary | ICD-10-CM

## 2020-09-14 DIAGNOSIS — R600 Localized edema: Secondary | ICD-10-CM | POA: Diagnosis not present

## 2020-09-14 DIAGNOSIS — M25562 Pain in left knee: Secondary | ICD-10-CM

## 2020-09-14 DIAGNOSIS — E876 Hypokalemia: Secondary | ICD-10-CM | POA: Diagnosis not present

## 2020-09-14 DIAGNOSIS — G8929 Other chronic pain: Secondary | ICD-10-CM

## 2020-09-14 DIAGNOSIS — T50905A Adverse effect of unspecified drugs, medicaments and biological substances, initial encounter: Secondary | ICD-10-CM

## 2020-09-14 MED ORDER — TRIAMTERENE-HCTZ 37.5-25 MG PO CAPS: ORAL_CAPSULE | ORAL | 1 refills | Status: DC

## 2020-09-14 NOTE — Progress Notes (Signed)
Subjective:  Patient ID: Michelle Barker, female    DOB: 10/19/1961  Age: 59 y.o. MRN: [id number]  CC: Hypertension and Annual Exam  This visit occurred during the SARS-CoV-2 public health emergency.  Safety protocols were in place, including screening questions prior to the visit, additional usage of staff PPE, and extensive cleaning of exam room while observing appropriate contact time as indicated for disinfecting solutions.    HPI Michelle Barker presents for a CPX.  She tells me that her BP has been well controlled. She is active and denies CP, DOE, or fatigue. Her LE edema is well controlled with the current diuretic regimen. She tells me that she recently saw her GYN and tells me that her PAP and mammogram were normal.  Outpatient Medications Prior to Visit  Medication Sig Dispense Refill   estradiol (VIVELLE-DOT) 0.05 MG/24HR patch Place 1 patch (0.05 mg total) onto the skin 2 (two) times a week. 8 patch 2   meloxicam (MOBIC) 15 MG tablet Take 1 tablet (15 mg total) by mouth daily. 90 tablet 0   potassium chloride SA (K-DUR) 20 MEQ tablet Take 1 tablet (20 mEq total) by mouth 3 (three) times daily. 90 tablet 1   progesterone (PROMETRIUM) 100 MG capsule Take 1 capsule (100 mg total) by mouth at bedtime. 30 capsule 2   triamterene-hydrochlorothiazide (DYAZIDE) 37.5-25 MG capsule TAKE 1 CAPSULE BY MOUTH ONCE DAILY . 90 capsule 1   No facility-administered medications prior to visit.    ROS Review of Systems  Constitutional: Negative for chills, diaphoresis, fatigue and fever.  HENT: Negative.   Eyes: Negative.   Respiratory: Negative for cough, chest tightness, shortness of breath and wheezing.   Cardiovascular: Positive for leg swelling. Negative for chest pain and palpitations.  Gastrointestinal: Negative.  Negative for abdominal pain, diarrhea, nausea and vomiting.  Endocrine: Negative.   Genitourinary: Negative.  Negative for difficulty urinating.  Musculoskeletal:  Positive for arthralgias. Negative for back pain and myalgias.  Skin: Negative.  Negative for color change, pallor and rash.  Neurological: Negative for dizziness, weakness, light-headedness and numbness.  Hematological: Negative for adenopathy. Does not bruise/bleed easily.  Psychiatric/Behavioral: Negative.     Objective:  BP 132/74    Pulse 80    Temp 98.2 F (36.8 C) (Oral)    Resp 16    Ht 5\' 3"  (1.6 m)    Wt 230 lb (104.3 kg)    SpO2 97%    BMI 40.74 kg/m   BP Readings from Last 3 Encounters:  09/14/20 132/74  03/09/20 128/70  07/31/19 138/86    Wt Readings from Last 3 Encounters:  09/14/20 230 lb (104.3 kg)  03/09/20 245 lb (111.1 kg)  07/31/19 237 lb 12 oz (107.8 kg)    Physical Exam Vitals reviewed.  Constitutional:      Appearance: Normal appearance.  HENT:     Nose: Nose normal.     Mouth/Throat:     Mouth: Mucous membranes are moist.  Eyes:     General: No scleral icterus.    Conjunctiva/sclera: Conjunctivae normal.  Cardiovascular:     Rate and Rhythm: Normal rate and regular rhythm.     Heart sounds: No murmur heard.   Pulmonary:     Effort: Pulmonary effort is normal.     Breath sounds: No stridor. No wheezing, rhonchi or rales.  Abdominal:     General: Abdomen is protuberant. Bowel sounds are normal. There is no distension.     Palpations: Abdomen is soft.  There is no hepatomegaly, splenomegaly or mass.  Musculoskeletal:        General: Normal range of motion.     Cervical back: Neck supple.     Right lower leg: Edema present.     Left lower leg: Edema present.     Comments: Non-pitting BLE edema  Lymphadenopathy:     Cervical: No cervical adenopathy.  Skin:    General: Skin is warm and dry.     Coloration: Skin is not pale.  Neurological:     General: No focal deficit present.     Mental Status: She is alert.  Psychiatric:        Mood and Affect: Mood normal.     Lab Results  Component Value Date   WBC 4.9 07/31/2019   HGB 14.7  07/31/2019   HCT 43.6 07/31/2019   PLT 288.0 07/31/2019   GLUCOSE 79 03/09/2020   CHOL 229 (H) 07/31/2019   TRIG 126.0 07/31/2019   HDL 86.80 07/31/2019   LDLCALC 117 (H) 07/31/2019   ALT 12 10/30/2018   AST 14 10/30/2018   NA 140 03/09/2020   K 3.4 (L) 03/09/2020   CL 105 03/09/2020   CREATININE 1.02 03/09/2020   BUN 15 03/09/2020   CO2 27 03/09/2020   TSH 2.06 03/09/2020   HGBA1C 5.8 03/06/2016    MM 3D SCREEN BREAST BILATERAL  Result Date: 03/19/2020 CLINICAL DATA:  Screening. EXAM: DIGITAL SCREENING BILATERAL MAMMOGRAM WITH TOMO AND CAD COMPARISON:  Previous exam(s). ACR Breast Density Category c: The breast tissue is heterogeneously dense, which may obscure small masses. FINDINGS: There are no findings suspicious for malignancy. Images were processed with CAD. IMPRESSION: No mammographic evidence of malignancy. A result letter of this screening mammogram will be mailed directly to the patient. RECOMMENDATION: Screening mammogram in one year. (Code:SM-B-01Y) BI-RADS CATEGORY  1: Negative. Electronically Signed   By: Zerita Boers M.D.   On: 03/19/2020 17:15    Assessment & Plan:   Michelle Barker was seen today for hypertension and annual exam.  Diagnoses and all orders for this visit:  Routine general medical examination at a health care facility- Exam completed, labs ordered, she refused a flu vaccine, cancer screenings are UTD, pt ed material was given. -     Lipid panel; Future  Essential hypertension- Her BP is adequately well controlled. I will monitor her lytes and renal function. -     triamterene-hydrochlorothiazide (DYAZIDE) 37.5-25 MG capsule; TAKE 1 CAPSULE BY MOUTH ONCE DAILY . -     Magnesium; Future -     CBC with Differential/Platelet; Future -     Basic metabolic panel; Future -     TSH; Future  Drug-induced hypokalemia- I will monitor her K+ level. -     triamterene-hydrochlorothiazide (DYAZIDE) 37.5-25 MG capsule; TAKE 1 CAPSULE BY MOUTH ONCE DAILY . -      Magnesium; Future  Bilateral leg edema -     triamterene-hydrochlorothiazide (DYAZIDE) 37.5-25 MG capsule; TAKE 1 CAPSULE BY MOUTH ONCE DAILY .  Chronic pain of both knees- the pain is w/c with an nsaid.   I am having Michelle Barker maintain her estradiol, progesterone, meloxicam, potassium chloride SA, and triamterene-hydrochlorothiazide.  Meds ordered this encounter  Medications   triamterene-hydrochlorothiazide (DYAZIDE) 37.5-25 MG capsule    Sig: TAKE 1 CAPSULE BY MOUTH ONCE DAILY .    Dispense:  90 capsule    Refill:  1     Follow-up: No follow-ups on file.  Scarlette Calico, MD

## 2020-09-19 ENCOUNTER — Encounter: Payer: Self-pay | Admitting: Internal Medicine

## 2020-12-27 IMAGING — DX LEFT KNEE - COMPLETE 4+ VIEW
4 series · 4 of 4 positions shown · non-contrast
Comparison: None.

CLINICAL DATA: Knee pain

EXAM:
LEFT KNEE - COMPLETE 4+ VIEW

[knee ap]
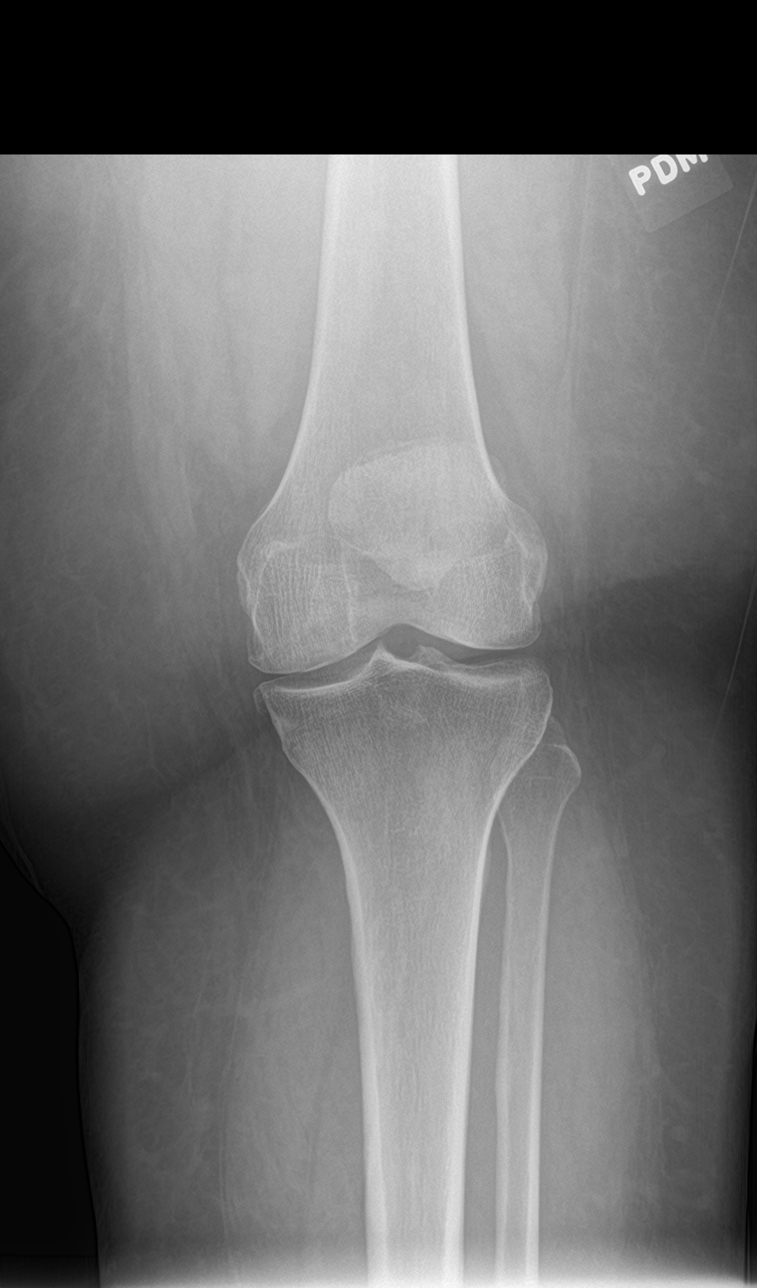

[knee tunnel]
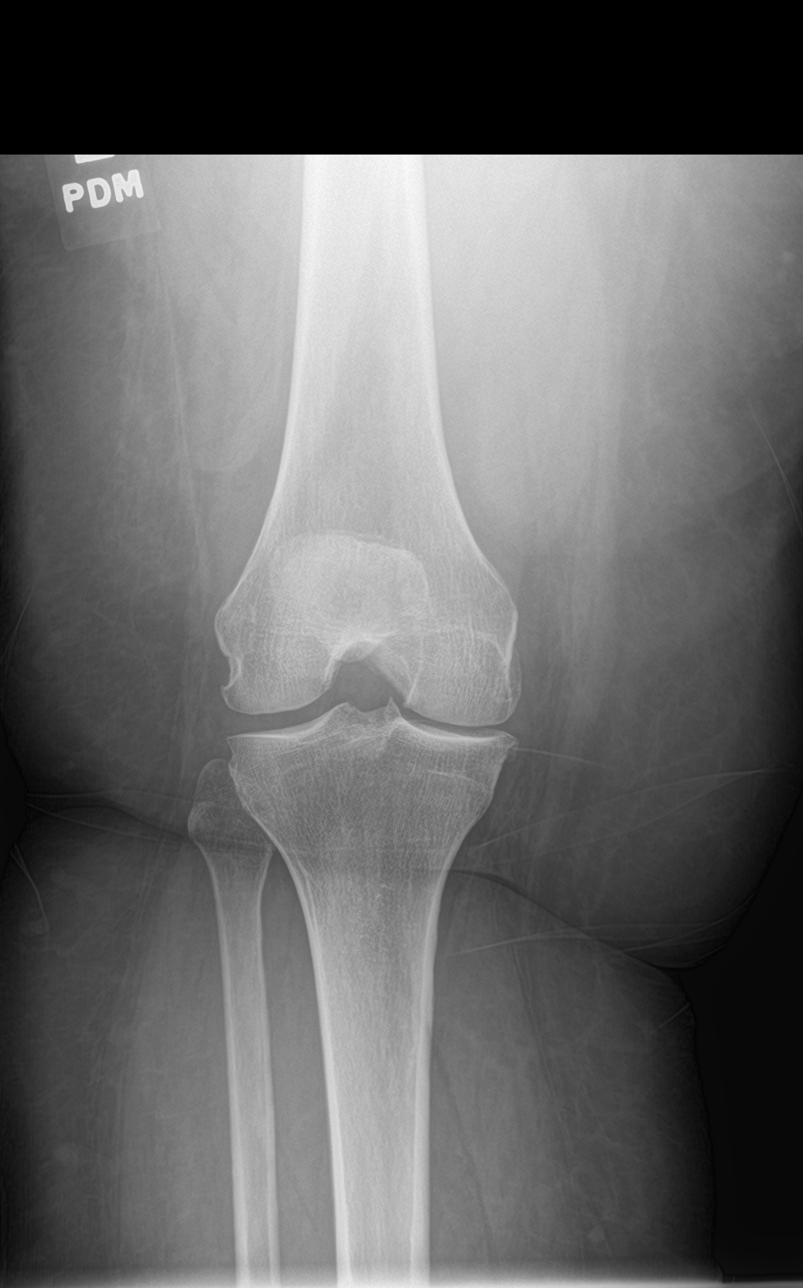

[knee lat]
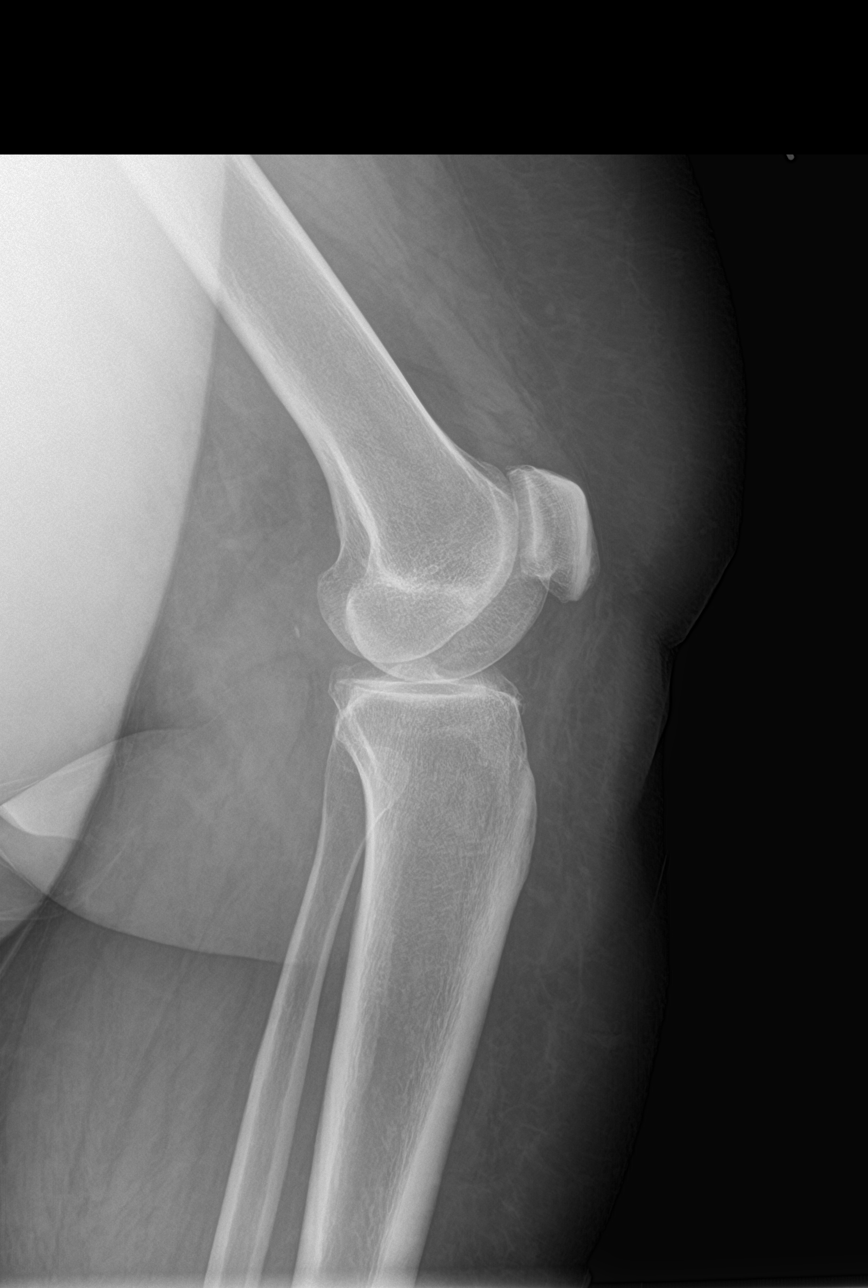

[sunrise]
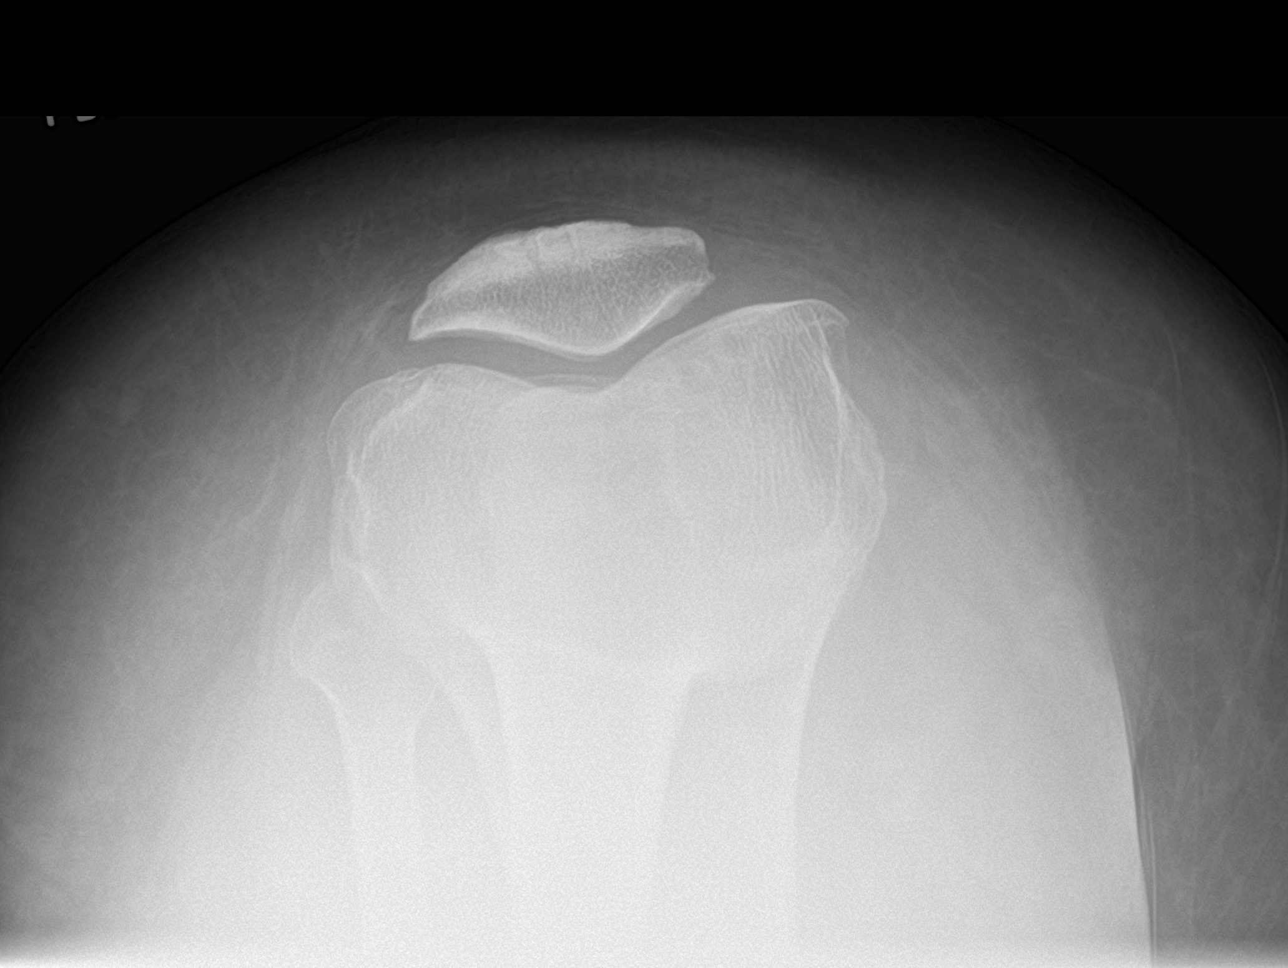

[4 of 4 positions shown; findings below may reference images not displayed]

FINDINGS: No fracture or malalignment. Mild patellofemoral and medial joint
space degenerative change. Trace knee effusion
IMPRESSION: Mild degenerative changes with trace knee effusion. No acute osseous
abnormality

## 2021-03-12 ENCOUNTER — Other Ambulatory Visit: Payer: Self-pay | Admitting: Internal Medicine

## 2021-03-12 DIAGNOSIS — E876 Hypokalemia: Secondary | ICD-10-CM

## 2021-03-12 DIAGNOSIS — R600 Localized edema: Secondary | ICD-10-CM

## 2021-03-12 DIAGNOSIS — I10 Essential (primary) hypertension: Secondary | ICD-10-CM

## 2021-03-12 DIAGNOSIS — T50905A Adverse effect of unspecified drugs, medicaments and biological substances, initial encounter: Secondary | ICD-10-CM

## 2021-05-11 ENCOUNTER — Other Ambulatory Visit: Payer: Self-pay | Admitting: Internal Medicine

## 2021-05-11 DIAGNOSIS — E876 Hypokalemia: Secondary | ICD-10-CM

## 2021-05-11 DIAGNOSIS — R600 Localized edema: Secondary | ICD-10-CM

## 2021-05-11 DIAGNOSIS — I10 Essential (primary) hypertension: Secondary | ICD-10-CM

## 2021-06-07 ENCOUNTER — Other Ambulatory Visit: Payer: Self-pay | Admitting: Internal Medicine

## 2021-06-07 DIAGNOSIS — I10 Essential (primary) hypertension: Secondary | ICD-10-CM

## 2021-06-07 DIAGNOSIS — T50905A Adverse effect of unspecified drugs, medicaments and biological substances, initial encounter: Secondary | ICD-10-CM

## 2021-06-07 DIAGNOSIS — E876 Hypokalemia: Secondary | ICD-10-CM

## 2021-06-07 DIAGNOSIS — R600 Localized edema: Secondary | ICD-10-CM

## 2021-08-30 ENCOUNTER — Other Ambulatory Visit: Payer: Self-pay

## 2021-09-02 ENCOUNTER — Ambulatory Visit (AMBULATORY_SURGERY_CENTER): Payer: Self-pay

## 2021-09-02 VITALS — Ht 63.0 in | Wt 234.0 lb

## 2021-09-02 DIAGNOSIS — Z1211 Encounter for screening for malignant neoplasm of colon: Secondary | ICD-10-CM

## 2021-09-02 MED ORDER — PLENVU 140 G PO SOLR
140.0000 g | ORAL | 0 refills | Status: DC
Start: 2021-09-02 — End: 2021-11-18

## 2021-09-02 NOTE — Patient Instructions (Addendum)
If you are age 59 or older, your body mass index should be between 23-30. Your Body mass index is 41.45 kg/m. If this is out of the aforementioned range listed, please consider follow up with your Primary Care Provider.  If you are age 52 or younger, your body mass index should be between 19-25. Your Body mass index is 41.45 kg/m. If this is out of the aformentioned range listed, please consider follow up with your Primary Care Provider.   ________________________________________________________  The [facility] providers would like to encourage you to use [facility] to communicate with providers for non-urgent requests or questions.  Due to long hold times on the telephone, sending your provider a message by [facility] may be a faster and more efficient way to get a response.  Please allow 48 business hours for a response.  Please remember that this is for non-urgent requests.  _______________________________________________________  Dennis Bast have been scheduled for a colonoscopy. Please follow written instructions given to you at your visit today.  Please pick up your prep supplies at the pharmacy within the next 1-3 days. If you use inhalers (even only as needed), please bring them with you on the day of your procedure.

## 2021-09-02 NOTE — Progress Notes (Signed)
No egg or soy allergy known to patient  No issues with past sedation with any surgeries or procedures Patient denies ever being told they had issues or difficulty with intubation  No FH of Malignant Hyperthermia No diet pills per patient No home 02 use per patient  No blood thinners per patient  Pt denies issues with constipation  No A fib or A flutter   COVID 19 guidelines implemented in [facility] today with Pt and RN  Pt is fully vaccinated  for Covid      Due to the COVID-19 pandemic we are asking patients to follow certain guidelines.  Pt aware of COVID protocols and [facility] guidelines   

## 2021-09-07 ENCOUNTER — Other Ambulatory Visit: Payer: Self-pay | Admitting: Internal Medicine

## 2021-09-11 ENCOUNTER — Other Ambulatory Visit: Payer: Self-pay | Admitting: Internal Medicine

## 2021-09-12 ENCOUNTER — Other Ambulatory Visit: Payer: Self-pay | Admitting: Internal Medicine

## 2021-09-22 ENCOUNTER — Encounter: Payer: Self-pay | Admitting: Gastroenterology

## 2021-10-13 ENCOUNTER — Ambulatory Visit: Payer: Managed Care, Other (non HMO) | Admitting: Internal Medicine

## 2021-10-21 ENCOUNTER — Encounter: Payer: BC Managed Care – PPO | Admitting: Gastroenterology

## 2021-11-09 ENCOUNTER — Telehealth: Payer: Self-pay | Admitting: Gastroenterology

## 2021-11-09 NOTE — Telephone Encounter (Signed)
Thank you for the update!

## 2021-11-09 NOTE — Telephone Encounter (Signed)
Good Afternoon Dr. Bryan Lemma,  Patient called to reschedule procedure for 12/30 at 9:00 due to a work conflict  Patient was rescheduled for 1/6 at 8:00

## 2021-11-11 ENCOUNTER — Encounter: Payer: Self-pay | Admitting: Gastroenterology

## 2021-11-15 ENCOUNTER — Ambulatory Visit: Payer: Managed Care, Other (non HMO) | Admitting: Internal Medicine

## 2021-11-17 ENCOUNTER — Other Ambulatory Visit: Payer: Self-pay

## 2021-11-17 ENCOUNTER — Ambulatory Visit (INDEPENDENT_AMBULATORY_CARE_PROVIDER_SITE_OTHER): Payer: Managed Care, Other (non HMO) | Admitting: Internal Medicine

## 2021-11-17 ENCOUNTER — Telehealth: Payer: Self-pay | Admitting: *Deleted

## 2021-11-17 ENCOUNTER — Encounter: Payer: Self-pay | Admitting: Internal Medicine

## 2021-11-17 VITALS — BP 130/80 | HR 81 | Temp 98.7°F | Ht 63.0 in | Wt 230.0 lb

## 2021-11-17 DIAGNOSIS — T50905A Adverse effect of unspecified drugs, medicaments and biological substances, initial encounter: Secondary | ICD-10-CM

## 2021-11-17 DIAGNOSIS — I10 Essential (primary) hypertension: Secondary | ICD-10-CM | POA: Diagnosis not present

## 2021-11-17 DIAGNOSIS — Z1231 Encounter for screening mammogram for malignant neoplasm of breast: Secondary | ICD-10-CM

## 2021-11-17 DIAGNOSIS — Z0000 Encounter for general adult medical examination without abnormal findings: Secondary | ICD-10-CM

## 2021-11-17 DIAGNOSIS — E876 Hypokalemia: Secondary | ICD-10-CM

## 2021-11-17 LAB — CBC WITH DIFFERENTIAL/PLATELET
Basophils Absolute: 0 10*3/uL (ref 0.0–0.1)
Basophils Relative: 0.8 % (ref 0.0–3.0)
Eosinophils Absolute: 0.1 10*3/uL (ref 0.0–0.7)
Eosinophils Relative: 1.3 % (ref 0.0–5.0)
HCT: 42.5 % (ref 36.0–46.0)
Hemoglobin: 14.2 g/dL (ref 12.0–15.0)
Lymphocytes Relative: 41.6 % (ref 12.0–46.0)
Lymphs Abs: 1.7 10*3/uL (ref 0.7–4.0)
MCHC: 33.5 g/dL (ref 30.0–36.0)
MCV: 90.2 fl (ref 78.0–100.0)
Monocytes Absolute: 0.5 10*3/uL (ref 0.1–1.0)
Monocytes Relative: 11.6 % (ref 3.0–12.0)
Neutro Abs: 1.8 10*3/uL (ref 1.4–7.7)
Neutrophils Relative %: 44.7 % (ref 43.0–77.0)
Platelets: 285 10*3/uL (ref 150.0–400.0)
RBC: 4.72 Mil/uL (ref 3.87–5.11)
RDW: 14.2 % (ref 11.5–15.5)
WBC: 4.1 10*3/uL (ref 4.0–10.5)

## 2021-11-17 LAB — BASIC METABOLIC PANEL
BUN: 12 mg/dL (ref 6–23)
CO2: 33 mEq/L — ABNORMAL HIGH (ref 19–32)
Calcium: 10.1 mg/dL (ref 8.4–10.5)
Chloride: 96 mEq/L (ref 96–112)
Creatinine, Ser: 1.13 mg/dL (ref 0.40–1.20)
GFR: 53.35 mL/min — ABNORMAL LOW (ref >60.00)
Glucose, Bld: 91 mg/dL (ref 70–99)
Potassium: 3.4 mEq/L — ABNORMAL LOW (ref 3.5–5.1)
Sodium: 136 mEq/L (ref 135–145)

## 2021-11-17 LAB — LIPID PANEL
Cholesterol: 258 mg/dL — ABNORMAL HIGH (ref 0–200)
HDL: 78.7 mg/dL (ref >39.00)
LDL Cholesterol: 163 mg/dL — ABNORMAL HIGH (ref 0–99)
NonHDL: 179.51
Total CHOL/HDL Ratio: 3
Triglycerides: 81 mg/dL (ref 0.0–149.0)
VLDL: 16.2 mg/dL (ref 0.0–40.0)

## 2021-11-17 LAB — MAGNESIUM: Magnesium: 1.7 mg/dL (ref 1.5–2.5)

## 2021-11-17 LAB — URINALYSIS, ROUTINE W REFLEX MICROSCOPIC
Bilirubin Urine: NEGATIVE
Hgb urine dipstick: NEGATIVE
Ketones, ur: NEGATIVE
Leukocytes,Ua: NEGATIVE
Nitrite: NEGATIVE
RBC / HPF: NONE SEEN (ref 0–?)
Specific Gravity, Urine: 1.01 (ref 1.000–1.030)
Total Protein, Urine: NEGATIVE
Urine Glucose: NEGATIVE
Urobilinogen, UA: 2 — AB (ref 0.0–1.0)
pH: 7 (ref 5.0–8.0)

## 2021-11-17 LAB — HEPATIC FUNCTION PANEL
ALT: 18 U/L (ref 0–35)
AST: 25 U/L (ref 0–37)
Albumin: 4.2 g/dL (ref 3.5–5.2)
Alkaline Phosphatase: 95 U/L (ref 39–117)
Bilirubin, Direct: 0.1 mg/dL (ref 0.0–0.3)
Total Bilirubin: 0.6 mg/dL (ref 0.2–1.2)
Total Protein: 7.9 g/dL (ref 6.0–8.3)

## 2021-11-17 LAB — TSH: TSH: 1.94 u[IU]/mL (ref 0.35–5.50)

## 2021-11-17 NOTE — Patient Instructions (Signed)

## 2021-11-17 NOTE — Progress Notes (Signed)
Subjective:  Patient ID: Michelle Barker, female    DOB: 09/01/1961  Age: 61 y.o. MRN: [id number]  CC: Annual Exam and Hypertension  This visit occurred during the SARS-CoV-2 public health emergency.  Safety protocols were in place, including screening questions prior to the visit, additional usage of staff PPE, and extensive cleaning of exam room while observing appropriate contact time as indicated for disinfecting solutions.    HPI Michelle Barker presents for a CPX and f/up -   She complains of chronic fatigue but denies chest pain, shortness of breath, diaphoresis, edema, dizziness, lightheadedness, or edema.  Outpatient Medications Prior to Visit  Medication Sig Dispense Refill   PARoxetine Mesylate 7.5 MG CAPS paroxetine mesylate (menopausal symptoms suppressant) 7.5 mg capsule     prednisoLONE acetate (PRED FORTE) 1 % ophthalmic suspension prednisolone acetate 1 % eye drops,suspension     triamterene-hydrochlorothiazide (DYAZIDE) 37.5-25 MG capsule Take 1 capsule by mouth once daily (Patient not taking: Reported on 11/18/2021) 90 capsule 0   valACYclovir (VALTREX) 500 MG tablet valacyclovir 500 mg tablet (Patient not taking: Reported on 11/18/2021)     neomycin-polymyxin-dexamethasone (MAXITROL) 0.1 % ophthalmic suspension neomycin-polymyxin-dexameth 3.5 mg/mL-10,000 unit/mL-0.1% eye drops     PEG-KCl-NaCl-NaSulf-Na Asc-C (PLENVU) 140 g SOLR Take 140 g by mouth as directed. Manufacturer's coupon Universal coupon code:BIN: P2366821; GROUP: [id number]; PCN: [id number]; ID: [id number]; PAY NO MORE $50 1 each 0   potassium chloride SA (K-DUR) 20 MEQ tablet Take 1 tablet (20 mEq total) by mouth 3 (three) times daily. (Patient not taking: Reported on 11/18/2021) 90 tablet 1   No facility-administered medications prior to visit.    ROS Review of Systems  Constitutional:  Positive for fatigue. Negative for appetite change, chills, diaphoresis, fever and unexpected weight change.  HENT: Negative.     Eyes: Negative.   Respiratory:  Negative for cough, chest tightness, shortness of breath and wheezing.   Cardiovascular:  Negative for chest pain, palpitations and leg swelling.  Gastrointestinal:  Negative for abdominal pain, constipation, diarrhea, nausea and vomiting.  Endocrine: Negative.   Genitourinary: Negative.  Negative for difficulty urinating and dysuria.  Musculoskeletal: Negative.   Skin: Negative.   Neurological:  Negative for dizziness, weakness, light-headedness, numbness and headaches.  Hematological:  Negative for adenopathy. Does not bruise/bleed easily.  Psychiatric/Behavioral: Negative.     Objective:  BP 130/80 (BP Location: Left Arm, Patient Position: Sitting, Cuff Size: Large)    Pulse 81    Temp 98.7 F (37.1 C) (Oral)    Ht 5\' 3"  (1.6 m)    Wt 230 lb (104.3 kg)    SpO2 99%    BMI 40.74 kg/m   BP Readings from Last 3 Encounters:  11/18/21 126/79  11/17/21 130/80  09/14/20 132/74    Wt Readings from Last 3 Encounters:  11/18/21 234 lb (106.1 kg)  11/17/21 230 lb (104.3 kg)  09/02/21 234 lb (106.1 kg)    Physical Exam Vitals reviewed.  Constitutional:      Appearance: She is obese.  HENT:     Nose: Nose normal.     Mouth/Throat:     Mouth: Mucous membranes are moist.  Eyes:     General: No scleral icterus.    Conjunctiva/sclera: Conjunctivae normal.  Cardiovascular:     Rate and Rhythm: Normal rate and regular rhythm.     Heart sounds: No murmur heard. Pulmonary:     Effort: Pulmonary effort is normal.     Breath sounds: No stridor. No wheezing, rhonchi or  rales.  Abdominal:     General: Abdomen is flat. There is no distension.     Palpations: There is no mass.     Tenderness: There is no abdominal tenderness. There is no guarding.     Hernia: No hernia is present.  Musculoskeletal:     Cervical back: Neck supple.  Lymphadenopathy:     Cervical: No cervical adenopathy.  Skin:    General: Skin is warm and dry.  Neurological:      General: No focal deficit present.     Mental Status: She is alert.  Psychiatric:        Mood and Affect: Mood normal.        Behavior: Behavior normal.    Lab Results  Component Value Date   WBC 4.1 11/17/2021   HGB 14.2 11/17/2021   HCT 42.5 11/17/2021   PLT 285.0 11/17/2021   GLUCOSE 91 11/17/2021   CHOL 258 (H) 11/17/2021   TRIG 81.0 11/17/2021   HDL 78.70 11/17/2021   LDLCALC 163 (H) 11/17/2021   ALT 18 11/17/2021   AST 25 11/17/2021   NA 136 11/17/2021   K 3.4 (L) 11/17/2021   CL 96 11/17/2021   CREATININE 1.13 11/17/2021   BUN 12 11/17/2021   CO2 33 (H) 11/17/2021   TSH 1.94 11/17/2021   HGBA1C 5.8 03/06/2016    MM 3D SCREEN BREAST BILATERAL  Result Date: 03/19/2020 CLINICAL DATA:  Screening. EXAM: DIGITAL SCREENING BILATERAL MAMMOGRAM WITH TOMO AND CAD COMPARISON:  Previous exam(s). ACR Breast Density Category c: The breast tissue is heterogeneously dense, which may obscure small masses. FINDINGS: There are no findings suspicious for malignancy. Images were processed with CAD. IMPRESSION: No mammographic evidence of malignancy. A result letter of this screening mammogram will be mailed directly to the patient. RECOMMENDATION: Screening mammogram in one year. (Code:SM-B-01Y) BI-RADS CATEGORY  1: Negative. Electronically Signed   By: Zerita Boers M.D.   On: 03/19/2020 17:15    Assessment & Plan:   Lynleigh was seen today for annual exam and hypertension.  Diagnoses and all orders for this visit:  Essential hypertension- Her blood pressure is adequately well controlled.  I will check labs to screen for secondary causes and endorgan damage.  We will treat the hypokalemia. -     Basic metabolic panel; Future -     Aldosterone + renin activity w/ ratio; Future -     Hepatic function panel; Future -     TSH; Future -     Urinalysis, Routine w reflex microscopic; Future -     Magnesium; Future -     CBC with Differential/Platelet; Future -     CBC with  Differential/Platelet -     Magnesium -     Urinalysis, Routine w reflex microscopic -     TSH -     Hepatic function panel -     Aldosterone + renin activity w/ ratio -     Basic metabolic panel -     potassium chloride (KLOR-CON 10) 10 MEQ tablet; Take 1 tablet (10 mEq total) by mouth daily.  Drug-induced hypokalemia -     Magnesium; Future -     Magnesium -     potassium chloride (KLOR-CON 10) 10 MEQ tablet; Take 1 tablet (10 mEq total) by mouth daily.  Routine general medical examination at a health care facility- Exam completed, labs reviewed, vaccines reviewed - She refused a flu vaccine, cancer screenings are up-to-date, patient education was given. -  Lipid panel; Future -     Lipid panel  Hypokalemia -     Basic metabolic panel; Future -     Aldosterone + renin activity w/ ratio; Future -     Magnesium; Future -     Magnesium -     Aldosterone + renin activity w/ ratio -     Basic metabolic panel -     potassium chloride (KLOR-CON 10) 10 MEQ tablet; Take 1 tablet (10 mEq total) by mouth daily.  Visit for screening mammogram -     MM DIGITAL SCREENING BILATERAL; Future   I have discontinued Melva Zillmer's potassium chloride SA and neomycin-polymyxin-dexamethasone. I am also having her start on potassium chloride. Additionally, I am having her maintain her PARoxetine Mesylate, valACYclovir, prednisoLONE acetate, and triamterene-hydrochlorothiazide.  Meds ordered this encounter  Medications   potassium chloride (KLOR-CON 10) 10 MEQ tablet    Sig: Take 1 tablet (10 mEq total) by mouth daily.    Dispense:  180 tablet    Refill:  0     Follow-up: Return in about 6 months (around 05/17/2022).  Scarlette Calico, MD

## 2021-11-18 ENCOUNTER — Ambulatory Visit (AMBULATORY_SURGERY_CENTER): Payer: Managed Care, Other (non HMO) | Admitting: Gastroenterology

## 2021-11-18 ENCOUNTER — Telehealth: Payer: Self-pay | Admitting: Gastroenterology

## 2021-11-18 ENCOUNTER — Encounter: Payer: Self-pay | Admitting: Gastroenterology

## 2021-11-18 VITALS — BP 126/79 | HR 59 | Temp 97.7°F | Resp 14 | Ht 63.0 in | Wt 234.0 lb

## 2021-11-18 DIAGNOSIS — K621 Rectal polyp: Secondary | ICD-10-CM

## 2021-11-18 DIAGNOSIS — D124 Benign neoplasm of descending colon: Secondary | ICD-10-CM

## 2021-11-18 DIAGNOSIS — Z800 Family history of malignant neoplasm of digestive organs: Secondary | ICD-10-CM | POA: Diagnosis not present

## 2021-11-18 DIAGNOSIS — D128 Benign neoplasm of rectum: Secondary | ICD-10-CM

## 2021-11-18 DIAGNOSIS — Z1211 Encounter for screening for malignant neoplasm of colon: Secondary | ICD-10-CM | POA: Diagnosis not present

## 2021-11-18 DIAGNOSIS — Z86010 Personal history of colonic polyps: Secondary | ICD-10-CM

## 2021-11-18 DIAGNOSIS — K5730 Diverticulosis of large intestine without perforation or abscess without bleeding: Secondary | ICD-10-CM

## 2021-11-18 DIAGNOSIS — K640 First degree hemorrhoids: Secondary | ICD-10-CM

## 2021-11-18 MED ORDER — SODIUM CHLORIDE 0.9 % IV SOLN: 500.0000 mL | Freq: Once | INTRAVENOUS | Status: DC

## 2021-11-18 NOTE — Telephone Encounter (Signed)
Patient called requesting a letter for her grandson's school since he was her care partner for her procedure today.   Name: Michelle Barker

## 2021-11-18 NOTE — Progress Notes (Signed)
Report to PACU, RN, vss, BBS= Clear.  

## 2021-11-18 NOTE — Telephone Encounter (Signed)
Chart entered in error

## 2021-11-18 NOTE — Op Note (Signed)
[facility] Patient Name: Michelle Barker Procedure Date: 11/18/2021 7:24 AM MRN: [id number] Endoscopist: Gerrit Heck , MD Age: 61 Referring MD:  Date of Birth: [birth year] Gender: Female Account #: [id number] Procedure:                Colonoscopy Indications:              Surveillance: Personal history of adenomatous                            polyps on last colonoscopy 3 years ago                           Family history notable for mother diagnosed with                            CRC at age 61. Colonoscopy in 08/2018 5 mm cecal                            polyp (path: Inflammatory polyp), 4 mm transverse                            polyp (path: Inflammatory polyp), 14 mm rectal                            polyp (path:TA), Diverticulosis, lipoma in splenic                            flexure. Medicines:                Monitored Anesthesia Care Procedure:                Pre-Anesthesia Assessment:                           - Prior to the procedure, a History and Physical                            was performed, and patient medications and                            allergies were reviewed. The patient's tolerance of                            previous anesthesia was also reviewed. The risks                            and benefits of the procedure and the sedation                            options and risks were discussed with the patient.                            All questions were answered, and informed consent  was obtained. Prior Anticoagulants: The patient has                            taken no previous anticoagulant or antiplatelet                            agents. ASA Grade Assessment: II - A patient with                            mild systemic disease. After reviewing the risks                            and benefits, the patient was deemed in                            satisfactory condition to undergo the procedure.                            After obtaining informed consent, the colonoscope                            was passed under direct vision. Throughout the                            procedure, the patient's blood pressure, pulse, and                            oxygen saturations were monitored continuously. The                            CF [id number] #[id number] was introduced through the anus                            and advanced to the the cecum, identified by                            appendiceal orifice and ileocecal valve. The                            colonoscopy was performed without difficulty. The                            patient tolerated the procedure well. The quality                            of the bowel preparation was fair. The ileocecal                            valve, appendiceal orifice, and rectum were                            photographed. Scope In: 8:04:37 AM Scope Out: 8:25:55 AM Scope Withdrawal Time: 0 hours 17 minutes 1 second  Total Procedure Duration: 0 hours  21 minutes 18 seconds  Findings:                 The perianal and digital rectal examinations were                            normal.                           A 3 mm polyp was found in the descending colon. The                            polyp was sessile. The polyp was removed with a                            cold snare. Resection and retrieval were complete.                            Estimated blood loss was minimal.                           A post polypectomy scar was found in the rectum.                            There was a small area of polypoid tissue. Could                            not be fully resected with cold snare, so this was                            resected with cold forceps and sent for histology.                            Estimated blood loss was minimal.                           Multiple small and large-mouthed diverticula were                            found in the sigmoid colon and transverse  colon.                           Non-bleeding internal hemorrhoids were found during                            retroflexion. The hemorrhoids were small.                           A moderate amount of stool was found in the entire                            colon, interfering with visualization. Lavage of                            the  area was performed using copious amounts of tap                            water, resulting in clearance with fair                            visualization.                           There was a lipoma, in the proximal descending                            colon. Complications:            No immediate complications. Estimated Blood Loss:     Estimated blood loss was minimal. Impression:               - Preparation of the colon was fair.                           - One 3 mm polyp in the descending colon, removed                            with a cold snare. Resected and retrieved.                           - Post-polypectomy scar in the rectum. Biopsied.                           - Diverticulosis in the sigmoid colon and in the                            transverse colon.                           - Non-bleeding internal hemorrhoids.                           - Stool in the entire examined colon.                           - Lipoma in the proximal descending colon. Recommendation:           - Patient has a contact number available for                            emergencies. The signs and symptoms of potential                            delayed complications were discussed with the                            patient. Return to normal activities tomorrow.                            Written discharge instructions were provided to the  patient.                           - Resume previous diet.                           - Continue present medications.                           - Await pathology results.                           -  Repeat colonoscopy in 2 years because the bowel                            preparation was suboptimal and for surveillance.                           - Return to GI clinic PRN. Gerrit Heck, MD 11/18/2021 8:43:35 AM

## 2021-11-18 NOTE — Patient Instructions (Signed)
Resume previous diet and medications. Awaiting pathology results. Repeat Colonoscopy in 2 years because bowel prep was suboptimal and for surveillance.  YOU HAD AN ENDOSCOPIC PROCEDURE TODAY AT [facility]:   Refer to the procedure report that was given to you for any specific questions about what was found during the examination.  If the procedure report does not answer your questions, please call your gastroenterologist to clarify.  If you requested that your care partner not be given the details of your procedure findings, then the procedure report has been included in a sealed envelope for you to review at your convenience later.  YOU SHOULD EXPECT: Some feelings of bloating in the abdomen. Passage of more gas than usual.  Walking can help get rid of the air that was put into your GI tract during the procedure and reduce the bloating. If you had a lower endoscopy (such as a colonoscopy or flexible sigmoidoscopy) you may notice spotting of blood in your stool or on the toilet paper. If you underwent a bowel prep for your procedure, you may not have a normal bowel movement for a few days.  Please Note:  You might notice some irritation and congestion in your nose or some drainage.  This is from the oxygen used during your procedure.  There is no need for concern and it should clear up in a day or so.  SYMPTOMS TO REPORT IMMEDIATELY:  Following lower endoscopy (colonoscopy or flexible sigmoidoscopy):  Excessive amounts of blood in the stool  Significant tenderness or worsening of abdominal pains  Swelling of the abdomen that is new, acute  Fever of 100F or higher   For urgent or emergent issues, a gastroenterologist can be reached at any hour by calling [phone]. Do not use MyChart messaging for urgent concerns.    DIET:  We do recommend a small meal at first, but then you may proceed to your regular diet.  Drink plenty of fluids but you should avoid alcoholic beverages  for 24 hours.  ACTIVITY:  You should plan to take it easy for the rest of today and you should NOT DRIVE or use heavy machinery until tomorrow (because of the sedation medicines used during the test).    FOLLOW UP: Our staff will call the number listed on your records 48-72 hours following your procedure to check on you and address any questions or concerns that you may have regarding the information given to you following your procedure. If we do not reach you, we will leave a message.  We will attempt to reach you two times.  During this call, we will ask if you have developed any symptoms of COVID 19. If you develop any symptoms (ie: fever, flu-like symptoms, shortness of breath, cough etc.) before then, please call [phone].  If you test positive for Covid 19 in the 2 weeks post procedure, please call and report this information to [place].    If any biopsies were taken you will be contacted by phone or by letter within the next 1-3 weeks.  Please call us at [phone] if you have not heard about the biopsies in 3 weeks.    SIGNATURES/CONFIDENTIALITY: You and/or your care partner have signed paperwork which will be entered into your electronic medical record.  These signatures attest to the fact that that the information above on your After Visit Summary has been reviewed and is understood.  Full responsibility of the confidentiality of this discharge information lies with you  and/or your care-partner.  °

## 2021-11-18 NOTE — Progress Notes (Signed)
GASTROENTEROLOGY PROCEDURE H&P NOTE   Primary Care Physician: Janith Lima, MD    Reason for Procedure:  Colon polyp surveillance  Plan:    Colonoscopy  Patient is appropriate for endoscopic procedure(s) in the ambulatory ([place]) setting.  The nature of the procedure, as well as the risks, benefits, and alternatives were carefully and thoroughly reviewed with the patient. Ample time for discussion and questions allowed. The patient understood, was satisfied, and agreed to proceed.     HPI: Michelle Barker is a 61 y.o. female who presents for colonoscopy for ongoing colon polyp surveillance.  No active GI symptoms.  Patient is otherwise without complaints or active issues today.  Family history notable for mother diagnosed with CRC at age 66.  Colonoscopy in 08/2018 5 mm cecal polyp (path: Inflammatory polyp), 4 mm transverse polyp (path: Inflammatory polyp), 14 mm rectal polyp (path:TA), Diverticulosis, in splenic flexure.  Past Medical History:  Diagnosis Date   Hypertension     Past Surgical History:  Procedure Laterality Date   TUBAL LIGATION     about 15 years ago   VAGINAL DELIVERY      Prior to Admission medications   Medication Sig Start Date End Date Taking? Authorizing Provider  PARoxetine Mesylate 7.5 MG CAPS paroxetine mesylate (menopausal symptoms suppressant) 7.5 mg capsule   Yes [provider]  potassium chloride SA (K-DUR) 20 MEQ tablet Take 1 tablet (20 mEq total) by mouth 3 (three) times daily. Patient not taking: Reported on 11/18/2021 07/31/19   Janith Lima, MD  prednisoLONE acetate (PRED FORTE) 1 % ophthalmic suspension prednisolone acetate 1 % eye drops,suspension    [provider]  triamterene-hydrochlorothiazide (DYAZIDE) 37.5-25 MG capsule Take 1 capsule by mouth once daily Patient not taking: Reported on 11/18/2021 09/12/21   Janith Lima, MD  valACYclovir (VALTREX) 500 MG tablet valacyclovir 500 mg tablet Patient not  taking: Reported on 11/18/2021    [provider]    Current Outpatient Medications  Medication Sig Dispense Refill   PARoxetine Mesylate 7.5 MG CAPS paroxetine mesylate (menopausal symptoms suppressant) 7.5 mg capsule     potassium chloride SA (K-DUR) 20 MEQ tablet Take 1 tablet (20 mEq total) by mouth 3 (three) times daily. (Patient not taking: Reported on 11/18/2021) 90 tablet 1   prednisoLONE acetate (PRED FORTE) 1 % ophthalmic suspension prednisolone acetate 1 % eye drops,suspension     triamterene-hydrochlorothiazide (DYAZIDE) 37.5-25 MG capsule Take 1 capsule by mouth once daily (Patient not taking: Reported on 11/18/2021) 90 capsule 0   valACYclovir (VALTREX) 500 MG tablet valacyclovir 500 mg tablet (Patient not taking: Reported on 11/18/2021)     Current Facility-Administered Medications  Medication Dose Route Frequency Provider Last Rate Last Admin   0.9 %  sodium chloride infusion  500 mL Intravenous Once Evani Shrider V, DO        Allergies as of 11/18/2021   (No Known Allergies)    Family History  Problem Relation Age of Onset   Hypertension Mother    Colon cancer Mother 31   Leukemia Father 51   Early death Sister 15       PE   Hypertension Brother    Diabetes Brother    Colon cancer Brother    Hypertension Brother    Diabetes Brother    Hypertension Brother    Diabetes Brother    Rectal cancer Neg Hx    Stomach cancer Neg Hx    Esophageal cancer Neg Hx  Social History   Socioeconomic History   Marital status: Divorced    Spouse name: Not on file   Number of children: Not on file   Years of education: Not on file   Highest education level: Not on file  Occupational History   Not on file  Tobacco Use   Smoking status: Never   Smokeless tobacco: Never  Vaping Use   Vaping Use: Never used  Substance and Sexual Activity   Alcohol use: Yes    Alcohol/week: 1.0 standard drink    Types: 1 Glasses of wine per week    Comment: occ   Drug use: No    Sexual activity: Not Currently    Comment: 1st intercourse 61 yo-Fewer than 5 partners  Other Topics Concern   Not on file  Social History Narrative   Not on file   Social Determinants of Health   Financial Resource Strain: Not on file  Food Insecurity: Not on file  Transportation Needs: Not on file  Physical Activity: Not on file  Stress: Not on file  Social Connections: Not on file  Intimate Partner Violence: Not on file    Physical Exam: Vital signs in last 24 hours: @BP  (!) 122/55    Pulse 78    Temp 97.7 F (36.5 C)    Ht 5\' 3"  (1.6 m)    Wt 234 lb (106.1 kg)    SpO2 97%    BMI 41.45 kg/m  GEN: NAD EYE: Sclerae anicteric ENT: MMM CV: Non-tachycardic Pulm: CTA b/l GI: Soft, NT/ND NEURO:  Alert & Oriented x 3   Gerrit Heck, DO [facility]   11/18/2021 7:51 AM

## 2021-11-18 NOTE — Telephone Encounter (Signed)
Sent school note for grandson via my chart.  Patient notified.

## 2021-11-18 NOTE — Progress Notes (Signed)
Called to room to assist during endoscopic procedure.  Patient ID and intended procedure confirmed with present staff. Received instructions for my participation in the procedure from the performing physician.  

## 2021-11-21 MED ORDER — POTASSIUM CHLORIDE ER 10 MEQ PO TBCR: 10.0000 meq | EXTENDED_RELEASE_TABLET | Freq: Every day | ORAL | 0 refills | Status: AC

## 2021-11-22 ENCOUNTER — Telehealth: Payer: Self-pay

## 2021-11-22 NOTE — Telephone Encounter (Signed)
°  Follow up Call-  Call back number 11/18/2021  Post procedure Call Back phone  # [phone]  Permission to leave phone message Yes  Some recent data might be hidden     Patient questions:  Do you have a fever, pain , or abdominal swelling? No. Pain Score  0 *  Have you tolerated food without any problems? Yes.    Have you been able to return to your normal activities? Yes.    Do you have any questions about your discharge instructions: Diet   No. Medications  No. Follow up visit  No.  Do you have questions or concerns about your Care? No.  Actions: * If pain score is 4 or above: No action needed, pain <4.

## 2021-11-23 LAB — ALDOSTERONE + RENIN ACTIVITY W/ RATIO
ALDO / PRA Ratio: 1.8 Ratio (ref 0.9–28.9)
Aldosterone: 12 ng/dL
Renin Activity: 6.62 ng/mL/h — ABNORMAL HIGH (ref 0.25–5.82)

## 2021-11-24 ENCOUNTER — Ambulatory Visit: Payer: Managed Care, Other (non HMO) | Admitting: Internal Medicine

## 2021-11-25 ENCOUNTER — Encounter: Payer: Self-pay | Admitting: Gastroenterology

## 2021-12-04 ENCOUNTER — Other Ambulatory Visit: Payer: Self-pay | Admitting: Internal Medicine

## 2021-12-08 ENCOUNTER — Other Ambulatory Visit: Payer: Self-pay | Admitting: Internal Medicine

## 2022-05-06 ENCOUNTER — Other Ambulatory Visit: Payer: Self-pay | Admitting: Internal Medicine

## 2022-08-05 ENCOUNTER — Other Ambulatory Visit: Payer: Self-pay | Admitting: Internal Medicine

## 2022-09-04 ENCOUNTER — Other Ambulatory Visit: Payer: Self-pay | Admitting: Internal Medicine

## 2022-09-16 ENCOUNTER — Other Ambulatory Visit: Payer: Self-pay | Admitting: Internal Medicine

## 2022-09-18 ENCOUNTER — Other Ambulatory Visit: Payer: Self-pay | Admitting: Internal Medicine

## 2022-09-18 DIAGNOSIS — E876 Hypokalemia: Secondary | ICD-10-CM

## 2022-09-18 DIAGNOSIS — I10 Essential (primary) hypertension: Secondary | ICD-10-CM

## 2022-09-18 MED ORDER — TRIAMTERENE-HCTZ 37.5-25 MG PO CAPS: 1.0000 | ORAL_CAPSULE | Freq: Every day | ORAL | 0 refills | Status: DC

## 2022-09-21 ENCOUNTER — Ambulatory Visit: Payer: BC Managed Care – PPO | Admitting: Internal Medicine

## 2022-09-21 ENCOUNTER — Encounter: Payer: Self-pay | Admitting: Internal Medicine

## 2022-09-21 ENCOUNTER — Ambulatory Visit (INDEPENDENT_AMBULATORY_CARE_PROVIDER_SITE_OTHER): Payer: BC Managed Care – PPO

## 2022-09-21 VITALS — BP 124/82 | HR 62 | Temp 98.3°F | Resp 16 | Ht 63.0 in | Wt 226.0 lb

## 2022-09-21 DIAGNOSIS — T148XXA Other injury of unspecified body region, initial encounter: Secondary | ICD-10-CM

## 2022-09-21 DIAGNOSIS — E876 Hypokalemia: Secondary | ICD-10-CM

## 2022-09-21 DIAGNOSIS — M79621 Pain in right upper arm: Secondary | ICD-10-CM

## 2022-09-21 DIAGNOSIS — I10 Essential (primary) hypertension: Secondary | ICD-10-CM

## 2022-09-21 LAB — CBC WITH DIFFERENTIAL/PLATELET
Basophils Absolute: 0 10*3/uL (ref 0.0–0.1)
Basophils Relative: 0.7 % (ref 0.0–3.0)
Eosinophils Absolute: 0 10*3/uL (ref 0.0–0.7)
Eosinophils Relative: 0.9 % (ref 0.0–5.0)
HCT: 45 % (ref 36.0–46.0)
Hemoglobin: 14.9 g/dL (ref 12.0–15.0)
Lymphocytes Relative: 50.9 % — ABNORMAL HIGH (ref 12.0–46.0)
Lymphs Abs: 1.9 10*3/uL (ref 0.7–4.0)
MCHC: 33 g/dL (ref 30.0–36.0)
MCV: 90.7 fl (ref 78.0–100.0)
Monocytes Absolute: 0.3 10*3/uL (ref 0.1–1.0)
Monocytes Relative: 7.6 % (ref 3.0–12.0)
Neutro Abs: 1.5 10*3/uL (ref 1.4–7.7)
Neutrophils Relative %: 39.9 % — ABNORMAL LOW (ref 43.0–77.0)
Platelets: 274 10*3/uL (ref 150.0–400.0)
RBC: 4.96 Mil/uL (ref 3.87–5.11)
RDW: 14.6 % (ref 11.5–15.5)
WBC: 3.7 10*3/uL — ABNORMAL LOW (ref 4.0–10.5)

## 2022-09-21 LAB — BASIC METABOLIC PANEL
BUN: 12 mg/dL (ref 6–23)
CO2: 31 mEq/L (ref 19–32)
Calcium: 10.2 mg/dL (ref 8.4–10.5)
Chloride: 98 mEq/L (ref 96–112)
Creatinine, Ser: 0.96 mg/dL (ref 0.40–1.20)
GFR: 64.49 mL/min (ref >60.00)
Glucose, Bld: 89 mg/dL (ref 70–99)
Potassium: 3.6 mEq/L (ref 3.5–5.1)
Sodium: 137 mEq/L (ref 135–145)

## 2022-09-21 NOTE — Progress Notes (Signed)
Subjective:  Patient ID: Michelle Barker, female    DOB: 01-Jun-1961  Age: 61 y.o. MRN: [id number]  CC: Hypertension   HPI Michelle Barker presents for f/up -  She complains of a 1 week history of pain around her right shoulder and right upper arm.  She describes it as a burning sensation that is exacerbated by movement and usage of the right upper extremity.  She denies any specific trauma or injury.  She has not taken anything for the pain.  She is active and denies chest pain, shortness of breath, diaphoresis, or edema.  She complains of intermittent dizziness.  Outpatient Medications Prior to Visit  Medication Sig Dispense Refill   PARoxetine Mesylate 7.5 MG CAPS paroxetine mesylate (menopausal symptoms suppressant) 7.5 mg capsule     potassium chloride (KLOR-CON 10) 10 MEQ tablet Take 1 tablet (10 mEq total) by mouth daily. 180 tablet 0   prednisoLONE acetate (PRED FORTE) 1 % ophthalmic suspension prednisolone acetate 1 % eye drops,suspension     triamterene-hydrochlorothiazide (DYAZIDE) 37.5-25 MG capsule Take 1 each (1 capsule total) by mouth daily. 30 capsule 0   valACYclovir (VALTREX) 500 MG tablet      No facility-administered medications prior to visit.    ROS Review of Systems  Constitutional: Negative.  Negative for chills, diaphoresis and fatigue.  HENT: Negative.    Eyes: Negative.   Respiratory:  Negative for cough, chest tightness, shortness of breath and wheezing.   Cardiovascular:  Negative for chest pain, palpitations and leg swelling.  Gastrointestinal:  Negative for abdominal pain, diarrhea, nausea and rectal pain.  Endocrine: Negative.   Genitourinary: Negative.   Musculoskeletal:  Positive for arthralgias. Negative for back pain, joint swelling and neck pain.  Neurological:  Positive for dizziness. Negative for weakness and light-headedness.  Hematological:  Negative for adenopathy. Does not bruise/bleed easily.  Psychiatric/Behavioral: Negative.       Objective:  BP 124/82 (BP Location: Left Arm, Patient Position: Sitting, Cuff Size: Large)   Pulse 62   Temp 98.3 F (36.8 C) (Oral)   Resp 16   Ht '5\' 3"'$  (1.6 m)   Wt 226 lb (102.5 kg)   SpO2 96%   BMI 40.03 kg/m   BP Readings from Last 3 Encounters:  09/21/22 124/82  11/18/21 126/79  11/17/21 130/80    Wt Readings from Last 3 Encounters:  09/21/22 226 lb (102.5 kg)  11/18/21 234 lb (106.1 kg)  11/17/21 230 lb (104.3 kg)    Physical Exam Vitals reviewed.  Constitutional:      Appearance: She is obese. She is not ill-appearing.  HENT:     Mouth/Throat:     Mouth: Mucous membranes are moist.  Eyes:     General: No scleral icterus.    Conjunctiva/sclera: Conjunctivae normal.  Cardiovascular:     Rate and Rhythm: Regular rhythm. Bradycardia present.     Heart sounds: Normal heart sounds, S1 normal and S2 normal. No murmur heard.    Comments: EKG- SB, 54 bpm ++++artifact No LVH or Q waves Pulmonary:     Effort: Pulmonary effort is normal.     Breath sounds: No stridor. No wheezing, rhonchi or rales.  Abdominal:     General: Abdomen is flat.     Palpations: There is no mass.     Tenderness: There is no abdominal tenderness. There is no guarding.     Hernia: No hernia is present.  Musculoskeletal:        General: No swelling or deformity.  Right shoulder: Normal. No swelling, bony tenderness or crepitus. Normal range of motion.     Left shoulder: Normal.     Right upper arm: No swelling, deformity, tenderness or bony tenderness.     Left upper arm: Normal.     Cervical back: Neck supple.     Right lower leg: No edema.     Left lower leg: No edema.  Lymphadenopathy:     Cervical: No cervical adenopathy.  Skin:    General: Skin is warm and dry.  Neurological:     General: No focal deficit present.  Psychiatric:        Mood and Affect: Mood normal.        Behavior: Behavior normal.     Lab Results  Component Value Date   WBC 3.7 (L) 09/21/2022    HGB 14.9 09/21/2022   HCT 45.0 09/21/2022   PLT 274.0 09/21/2022   GLUCOSE 89 09/21/2022   CHOL 258 (H) 11/17/2021   TRIG 81.0 11/17/2021   HDL 78.70 11/17/2021   LDLCALC 163 (H) 11/17/2021   ALT 18 11/17/2021   AST 25 11/17/2021   NA 137 09/21/2022   K 3.6 09/21/2022   CL 98 09/21/2022   CREATININE 0.96 09/21/2022   BUN 12 09/21/2022   CO2 31 09/21/2022   TSH 1.94 11/17/2021   HGBA1C 5.8 03/06/2016    MM 3D SCREEN BREAST BILATERAL  Result Date: 03/19/2020 CLINICAL DATA:  Screening. EXAM: DIGITAL SCREENING BILATERAL MAMMOGRAM WITH TOMO AND CAD COMPARISON:  Previous exam(s). ACR Breast Density Category c: The breast tissue is heterogeneously dense, which may obscure small masses. FINDINGS: There are no findings suspicious for malignancy. Images were processed with CAD. IMPRESSION: No mammographic evidence of malignancy. A result letter of this screening mammogram will be mailed directly to the patient. RECOMMENDATION: Screening mammogram in one year. (Code:SM-B-01Y) BI-RADS CATEGORY  1: Negative. Electronically Signed   By: Zerita Boers M.D.   On: 03/19/2020 17:15   No results found.   Assessment & Plan:   Michelle Barker was seen today for hypertension.  Diagnoses and all orders for this visit:  Essential hypertension- Her blood pressure is well controlled.  Electrolytes and renal function are normal. -     Basic metabolic panel; Future -     CBC with Differential/Platelet; Future -     CBC with Differential/Platelet -     Basic metabolic panel -     EKG [id number]  Hypokalemia -     Basic metabolic panel; Future -     Basic metabolic panel  Pain of right upper arm -     DG Humerus Right; Future -     meloxicam (MOBIC) 7.5 MG tablet; Take 1 tablet (7.5 mg total) by mouth daily.  Musculoskeletal strain -     meloxicam (MOBIC) 7.5 MG tablet; Take 1 tablet (7.5 mg total) by mouth daily.   I am having Michelle Barker start on meloxicam. I am also having her maintain her PARoxetine  Mesylate, valACYclovir, prednisoLONE acetate, potassium chloride, and triamterene-hydrochlorothiazide.  Meds ordered this encounter  Medications   meloxicam (MOBIC) 7.5 MG tablet    Sig: Take 1 tablet (7.5 mg total) by mouth daily.    Dispense:  30 tablet    Refill:  0     Follow-up: No follow-ups on file.  Michelle Calico, MD

## 2022-09-22 DIAGNOSIS — T148XXA Other injury of unspecified body region, initial encounter: Secondary | ICD-10-CM | POA: Insufficient documentation

## 2022-09-22 MED ORDER — MELOXICAM 7.5 MG PO TABS: 7.5000 mg | ORAL_TABLET | Freq: Every day | ORAL | 0 refills | Status: DC

## 2022-10-09 ENCOUNTER — Other Ambulatory Visit: Payer: Self-pay | Admitting: Internal Medicine

## 2022-10-09 DIAGNOSIS — E876 Hypokalemia: Secondary | ICD-10-CM

## 2022-10-09 DIAGNOSIS — I10 Essential (primary) hypertension: Secondary | ICD-10-CM

## 2022-10-12 ENCOUNTER — Other Ambulatory Visit: Payer: Self-pay | Admitting: Internal Medicine

## 2022-10-12 DIAGNOSIS — T148XXA Other injury of unspecified body region, initial encounter: Secondary | ICD-10-CM

## 2022-10-12 DIAGNOSIS — M79621 Pain in right upper arm: Secondary | ICD-10-CM

## 2023-03-08 ENCOUNTER — Emergency Department (HOSPITAL_COMMUNITY): Payer: 59

## 2023-03-08 ENCOUNTER — Emergency Department (HOSPITAL_COMMUNITY)
Admission: EM | Admit: 2023-03-08 | Discharge: 2023-03-08 | Disposition: A | Discharge status: Home / Self Care | Payer: 59 | Attending: Emergency Medicine | Admitting: Emergency Medicine

## 2023-03-08 ENCOUNTER — Encounter (HOSPITAL_COMMUNITY): Payer: Self-pay | Admitting: *Deleted

## 2023-03-08 ENCOUNTER — Other Ambulatory Visit: Payer: Self-pay

## 2023-03-08 DIAGNOSIS — I10 Essential (primary) hypertension: Secondary | ICD-10-CM | POA: Diagnosis not present

## 2023-03-08 DIAGNOSIS — R109 Unspecified abdominal pain: Secondary | ICD-10-CM | POA: Insufficient documentation

## 2023-03-08 DIAGNOSIS — M791 Myalgia, unspecified site: Secondary | ICD-10-CM

## 2023-03-08 DIAGNOSIS — M25551 Pain in right hip: Secondary | ICD-10-CM | POA: Diagnosis not present

## 2023-03-08 DIAGNOSIS — W19XXXA Unspecified fall, initial encounter: Secondary | ICD-10-CM

## 2023-03-08 DIAGNOSIS — S60221A Contusion of right hand, initial encounter: Secondary | ICD-10-CM | POA: Insufficient documentation

## 2023-03-08 DIAGNOSIS — M546 Pain in thoracic spine: Secondary | ICD-10-CM | POA: Insufficient documentation

## 2023-03-08 DIAGNOSIS — S6991XA Unspecified injury of right wrist, hand and finger(s), initial encounter: Secondary | ICD-10-CM | POA: Diagnosis present

## 2023-03-08 DIAGNOSIS — Z79899 Other long term (current) drug therapy: Secondary | ICD-10-CM | POA: Insufficient documentation

## 2023-03-08 DIAGNOSIS — W16112A Fall into natural body of water striking water surface causing other injury, initial encounter: Secondary | ICD-10-CM | POA: Insufficient documentation

## 2023-03-08 DIAGNOSIS — T148XXA Other injury of unspecified body region, initial encounter: Secondary | ICD-10-CM

## 2023-03-08 MED ORDER — LIDOCAINE 5 % EX PTCH: 1.0000 | MEDICATED_PATCH | CUTANEOUS | 0 refills | Status: AC

## 2023-03-08 MED ORDER — ACETAMINOPHEN 500 MG PO TABS
1000.0000 mg | ORAL_TABLET | Freq: Once | ORAL | Status: AC
  Administered 2023-03-08: 1000 mg via ORAL
  Filled 2023-03-08: qty 2

## 2023-03-08 MED ORDER — LIDOCAINE 5 % EX PTCH
1.0000 | MEDICATED_PATCH | Freq: Once | CUTANEOUS | Status: DC
  Administered 2023-03-08: 1 via TRANSDERMAL
  Filled 2023-03-08: qty 1

## 2023-03-08 MED ORDER — IBUPROFEN 400 MG PO TABS
600.0000 mg | ORAL_TABLET | Freq: Once | ORAL | Status: AC
  Administered 2023-03-08: 600 mg via ORAL
  Filled 2023-03-08: qty 1

## 2023-03-08 NOTE — ED Triage Notes (Signed)
Pt arrived with EMS post fall after slipping on water in [employer]. Pt repors falling onto R hip and leg and hit R ribcage on a shelf. -LOC

## 2023-03-08 NOTE — Discharge Instructions (Signed)
You were seen in the emergency department after your fall.  Your x-ray showed no signs of broken bones but you likely bruised your hip and your right side.  You will likely feel more sore over the next few days before you start to feel better.  You can continue to take Tylenol or your meloxicam at home for pain as well as use lidocaine patches, ice or heat.  You should follow-up with your primary doctor in the next few days to have your symptoms rechecked.  You should return to the emergency department for significantly worsening pain, numbness or weakness in your legs if you have severe shortness of breath or if you have any other new or concerning symptoms.

## 2023-03-08 NOTE — ED Provider Notes (Incomplete)
[employer] EMERGENCY DEPARTMENT AT [facility] Provider Note   CSN: [id number] Arrival date & time: 03/08/23  1903     History {Add pertinent medical, surgical, social history, OB history to HPI:1} Chief Complaint  Patient presents with  . Fall    Michelle Barker is a 62 y.o. female.  Patient is a 62 year old female with a past medical history of hypertension presenting to the emergency department after a fall.  The patient states that she was in the store shortly prior to arrival and they had just mopped the floors when she slipped and fell landing on her right side.  She states that she is having pain all the way down her right side into her right hip.  She denies hitting her head or losing consciousness.  She denies any nausea or vomiting, numbness or weakness.  She denies any blood thinner use.  The history is provided by the patient.  Fall       Home Medications Prior to Admission medications   Medication Sig Start Date End Date Taking? Authorizing Provider  meloxicam (MOBIC) 7.5 MG tablet Take 1 tablet by mouth once daily 10/12/22   Etta Grandchild, MD  PARoxetine Mesylate 7.5 MG CAPS paroxetine mesylate (menopausal symptoms suppressant) 7.5 mg capsule    [provider]  potassium chloride (KLOR-CON 10) 10 MEQ tablet Take 1 tablet (10 mEq total) by mouth daily. 11/21/21   Etta Grandchild, MD  prednisoLONE acetate (PRED FORTE) 1 % ophthalmic suspension prednisolone acetate 1 % eye drops,suspension    [provider]  triamterene-hydrochlorothiazide (DYAZIDE) 37.5-25 MG capsule Take 1 capsule by mouth once daily 10/09/22   Etta Grandchild, MD  valACYclovir (VALTREX) 500 MG tablet     [provider]      Allergies    Patient has no known allergies.    Review of Systems   Review of Systems  Physical Exam Updated Vital Signs BP 139/76   Pulse (!) 59   Temp 97.8 F (36.6 C)   Resp 18   Ht 5\' 3"  (1.6 m)   Wt 113.9 kg   SpO2 100%    BMI 44.46 kg/m  Physical Exam Vitals and nursing note reviewed.  Constitutional:      General: She is not in acute distress.    Appearance: Normal appearance. She is obese.  HENT:     Head: Normocephalic and atraumatic.     Nose: Nose normal.     Mouth/Throat:     Mouth: Mucous membranes are moist.     Pharynx: Oropharynx is clear.  Eyes:     Conjunctiva/sclera: Conjunctivae normal.  Neck:     Comments: No midline neck tenderness Cardiovascular:     Rate and Rhythm: Normal rate and regular rhythm.     Heart sounds: Normal heart sounds.  Pulmonary:     Effort: Pulmonary effort is normal.     Breath sounds: Normal breath sounds.  Abdominal:     General: Abdomen is flat.     Palpations: Abdomen is soft.     Tenderness: There is no abdominal tenderness.  Musculoskeletal:        General: Normal range of motion.     Cervical back: Normal range of motion and neck supple.     Comments: No midline back tenderness Tenderness to palpation of right lateral hip, no pain with internal/external rotation Tenderness to palpation of right flank, no overlying skin changes Bruising with tenderness to palpation over right bicep,  no shoulder or elbow tenderness with full elbow flexion/extension, supination/pronation and full shoulder abduction and flexion without pain No bony tenderness in left upper extremity or left lower extremity  Skin:    General: Skin is warm and dry.  Neurological:     General: No focal deficit present.     Mental Status: She is alert and oriented to person, place, and time.     Sensory: No sensory deficit.     Motor: No weakness.  Psychiatric:        Mood and Affect: Mood normal.        Behavior: Behavior normal.     ED Results / Procedures / Treatments   Labs (all labs ordered are listed, but only abnormal results are displayed) Labs Reviewed - No data to display  EKG None  Radiology DG Ribs Unilateral W/Chest Right  Result Date: 03/08/2023 CLINICAL  DATA:  Larey Seat, right rib pain EXAM: RIGHT RIBS AND CHEST - 3+ VIEW COMPARISON:  10/30/2018 FINDINGS: Frontal view of the chest as well as frontal and oblique views of the right thoracic cage are obtained. Cardiac silhouette is enlarged but stable. No airspace disease, effusion, or pneumothorax. There are no acute displaced rib fractures. IMPRESSION: 1. No acute intrathoracic process. 2. No displaced rib fracture. Electronically Signed   By: Sharlet Salina M.D.   On: 03/08/2023 21:05   DG Hip Unilat  With Pelvis 2-3 Views Right  Result Date: 03/08/2023 CLINICAL DATA:  Larey Seat, right hip pain EXAM: DG HIP (WITH OR WITHOUT PELVIS) 2-3V RIGHT COMPARISON:  None Available. FINDINGS: Frontal view of the pelvis as well as frontal and frogleg lateral views of the right hip are obtained. There are no acute displaced fractures. Alignment is anatomic. Mild symmetrical bilateral hip osteoarthritis. Sacroiliac joints are normal. IMPRESSION: 1. Unremarkable pelvis and right hip. Electronically Signed   By: Sharlet Salina M.D.   On: 03/08/2023 21:04    Procedures Procedures  {Document cardiac monitor, telemetry assessment procedure when appropriate:1}  Medications Ordered in ED Medications  lidocaine (LIDODERM) 5 % 1-3 patch (1 patch Transdermal Patch Applied 03/08/23 2116)  acetaminophen (TYLENOL) tablet 1,000 mg (1,000 mg Oral Given 03/08/23 2116)  ibuprofen (ADVIL) tablet 600 mg (600 mg Oral Given 03/08/23 2115)    ED Course/ Medical Decision Making/ A&P Clinical Course as of 03/08/23 2234  Thu Mar 08, 2023  2132 Xrays negative, Patient is stable for discharge with PCP follow up. [VK]    Clinical Course User Index [VK] Rexford Maus, DO   {   Click here for ABCD2, HEART and other calculatorsREFRESH Note before signing :1}                          Medical Decision Making This patient presents to the ED with chief complaint(s) of *** with pertinent past medical history of *** which further complicates  the presenting complaint. The complaint involves an extensive differential diagnosis and also carries with it a high risk of complications and morbidity.    The differential diagnosis includes ***   Additional history obtained: Additional history obtained from {additional history:26846} Records reviewed {records:26847}  ED Course and Reassessment:   Independent labs interpretation:  The following labs were independently interpreted: ***  Independent visualization of imaging: - I independently visualized the following imaging with scope of interpretation limited to determining acute life threatening conditions related to emergency care: ***, which revealed ***  Consultation: - Consulted or discussed management/test interpretation  w/ external professional: ***  Consideration for admission or further workup: *** Social Determinants of health: ***    Amount and/or Complexity of Data Reviewed Radiology: ordered.  Risk OTC drugs. Prescription drug management.   ***  {Document critical care time when appropriate:1} {Document review of labs and clinical decision tools ie heart score, Chads2Vasc2 etc:1}  {Document your independent review of radiology images, and any outside records:1} {Document your discussion with family members, caretakers, and with consultants:1} {Document social determinants of health affecting pt's care:1} {Document your decision making why or why not admission, treatments were needed:1} Final Clinical Impression(s) / ED Diagnoses Final diagnoses:  None    Rx / DC Orders ED Discharge Orders     None

## 2023-03-13 ENCOUNTER — Ambulatory Visit (INDEPENDENT_AMBULATORY_CARE_PROVIDER_SITE_OTHER): Payer: 59 | Admitting: Internal Medicine

## 2023-03-13 ENCOUNTER — Encounter: Payer: Self-pay | Admitting: Internal Medicine

## 2023-03-13 ENCOUNTER — Ambulatory Visit (INDEPENDENT_AMBULATORY_CARE_PROVIDER_SITE_OTHER): Payer: 59

## 2023-03-13 VITALS — BP 120/78 | HR 78 | Temp 99.0°F | Resp 16 | Ht 63.0 in | Wt 229.0 lb

## 2023-03-13 DIAGNOSIS — S59911A Unspecified injury of right forearm, initial encounter: Secondary | ICD-10-CM | POA: Insufficient documentation

## 2023-03-13 DIAGNOSIS — E876 Hypokalemia: Secondary | ICD-10-CM

## 2023-03-13 DIAGNOSIS — I10 Essential (primary) hypertension: Secondary | ICD-10-CM

## 2023-03-13 DIAGNOSIS — S4991XA Unspecified injury of right shoulder and upper arm, initial encounter: Secondary | ICD-10-CM | POA: Insufficient documentation

## 2023-03-13 DIAGNOSIS — S3992XA Unspecified injury of lower back, initial encounter: Secondary | ICD-10-CM

## 2023-03-13 DIAGNOSIS — T148XXA Other injury of unspecified body region, initial encounter: Secondary | ICD-10-CM

## 2023-03-13 DIAGNOSIS — M170 Bilateral primary osteoarthritis of knee: Secondary | ICD-10-CM

## 2023-03-13 DIAGNOSIS — M5441 Lumbago with sciatica, right side: Secondary | ICD-10-CM

## 2023-03-13 DIAGNOSIS — M5442 Lumbago with sciatica, left side: Secondary | ICD-10-CM

## 2023-03-13 DIAGNOSIS — M79621 Pain in right upper arm: Secondary | ICD-10-CM

## 2023-03-13 NOTE — Patient Instructions (Signed)
Acute Back Pain, Adult Acute back pain is sudden and usually short-lived. It is often caused by an injury to the muscles and tissues in the back. The injury may result from: A muscle, tendon, or ligament getting overstretched or torn. Ligaments are tissues that connect bones to each other. Lifting something improperly can cause a back strain. Wear and tear (degeneration) of the spinal disks. Spinal disks are circular tissue that provide cushioning between the bones of the spine (vertebrae). Twisting motions, such as while playing sports or doing yard work. A hit to the back. Arthritis. You may have a physical exam, lab tests, and imaging tests to find the cause of your pain. Acute back pain usually goes away with rest and home care. Follow these instructions at home: Managing pain, stiffness, and swelling Take over-the-counter and prescription medicines only as told by your health care provider. Treatment may include medicines for pain and inflammation that are taken by mouth or applied to the skin, or muscle relaxants. Your health care provider may recommend applying ice during the first 24-48 hours after your pain starts. To do this: Put ice in a plastic bag. Place a towel between your skin and the bag. Leave the ice on for 20 minutes, 2-3 times a day. Remove the ice if your skin turns bright red. This is very important. If you cannot feel pain, heat, or cold, you have a greater risk of damage to the area. If directed, apply heat to the affected area as often as told by your health care provider. Use the heat source that your health care provider recommends, such as a moist heat pack or a heating pad. Place a towel between your skin and the heat source. Leave the heat on for 20-30 minutes. Remove the heat if your skin turns bright red. This is especially important if you are unable to feel pain, heat, or cold. You have a greater risk of getting burned. Activity  Do not stay in bed. Staying in  bed for more than 1-2 days can delay your recovery. Sit up and stand up straight. Avoid leaning forward when you sit or hunching over when you stand. If you work at a desk, sit close to it so you do not need to lean over. Keep your chin tucked in. Keep your neck drawn back, and keep your elbows bent at a 90-degree angle (right angle). Sit high and close to the steering wheel when you drive. Add lower back (lumbar) support to your car seat, if needed. Take short walks on even surfaces as soon as you are able. Try to increase the length of time you walk each day. Do not sit, drive, or stand in one place for more than 30 minutes at a time. Sitting or standing for long periods of time can put stress on your back. Do not drive or use heavy machinery while taking prescription pain medicine. Use proper lifting techniques. When you bend and lift, use positions that put less stress on your back: Bend your knees. Keep the load close to your body. Avoid twisting. Exercise regularly as told by your health care provider. Exercising helps your back heal faster and helps prevent back injuries by keeping muscles strong and flexible. Work with a physical therapist to make a safe exercise program, as recommended by your health care provider. Do any exercises as told by your physical therapist. Lifestyle Maintain a healthy weight. Extra weight puts stress on your back and makes it difficult to have good   posture. Avoid activities or situations that make you feel anxious or stressed. Stress and anxiety increase muscle tension and can make back pain worse. Learn ways to manage anxiety and stress, such as through exercise. General instructions Sleep on a firm mattress in a comfortable position. Try lying on your side with your knees slightly bent. If you lie on your back, put a pillow under your knees. Keep your head and neck in a straight line with your spine (neutral position) when using electronic equipment like  smartphones or pads. To do this: Raise your smartphone or pad to look at it instead of bending your head or neck to look down. Put the smartphone or pad at the level of your face while looking at the screen. Follow your treatment plan as told by your health care provider. This may include: Cognitive or behavioral therapy. Acupuncture or massage therapy. Meditation or yoga. Contact a health care provider if: You have pain that is not relieved with rest or medicine. You have increasing pain going down into your legs or buttocks. Your pain does not improve after 2 weeks. You have pain at night. You lose weight without trying. You have a fever or chills. You develop nausea or vomiting. You develop abdominal pain. Get help right away if: You develop new bowel or bladder control problems. You have unusual weakness or numbness in your arms or legs. You feel faint. These symptoms may represent a serious problem that is an emergency. Do not wait to see if the symptoms will go away. Get medical help right away. Call your local emergency services (911 in the [place] Do not drive yourself to the hospital. Summary Acute back pain is sudden and usually short-lived. Use proper lifting techniques. When you bend and lift, use positions that put less stress on your back. Take over-the-counter and prescription medicines only as told by your health care provider, and apply heat or ice as told. This information is not intended to replace advice given to you by your health care provider. Make sure you discuss any questions you have with your health care provider. Document Revised: 01/21/2021 Document Reviewed: 01/21/2021 Elsevier Patient Education  2023 [employer].  

## 2023-03-13 NOTE — ED Provider Notes (Signed)
[place] EMERGENCY DEPARTMENT AT [facility] Provider Note   CSN: [id number] Arrival date & time: 03/08/23  1903     History  Chief Complaint  Patient presents with   Marletta Lor    Michelle Barker is a 62 y.o. female.  Patient is a 62 year old female with a past medical history of hypertension presenting to the emergency department after a fall.  The patient states that she was in the store shortly prior to arrival and they had just mopped the floors when she slipped and fell landing on her right side.  She states that she is having pain all the way down her right side into her right hip.  She denies hitting her head or losing consciousness.  She denies any nausea or vomiting, numbness or weakness.  She denies any blood thinner use.   Fall  Patient is a 62 year old female with a past medical history of hypertension presenting to the emergency department after a fall. The patient states that she was in the store shortly prior to arrival and they had just mopped the floors when she slipped and fell landing on her right side. She states that she is having pain all the way down her right side into her right hip. She denies hitting her head or losing consciousness. She denies any nausea or vomiting, numbness or weakness. She denies any blood thinner use.      Home Medications Prior to Admission medications   Medication Sig Start Date End Date Taking? Authorizing Provider  lidocaine (LIDODERM) 5 % Place 1 patch onto the skin daily. Remove & Discard patch within 12 hours or as directed by MD 03/08/23  Yes Theresia Lo, Cecile Sheerer, DO  meloxicam (MOBIC) 7.5 MG tablet Take 1 tablet by mouth once daily 10/12/22   Etta Grandchild, MD  PARoxetine Mesylate 7.5 MG CAPS paroxetine mesylate (menopausal symptoms suppressant) 7.5 mg capsule    [provider]  potassium chloride (KLOR-CON 10) 10 MEQ tablet Take 1 tablet (10 mEq total) by mouth daily. 11/21/21   Etta Grandchild, MD  prednisoLONE  acetate (PRED FORTE) 1 % ophthalmic suspension prednisolone acetate 1 % eye drops,suspension    [provider]  triamterene-hydrochlorothiazide (DYAZIDE) 37.5-25 MG capsule Take 1 capsule by mouth once daily 10/09/22   Etta Grandchild, MD  valACYclovir (VALTREX) 500 MG tablet     [provider]      Allergies    Patient has no known allergies.    Review of Systems   Review of Systems  Physical Exam Updated Vital Signs BP 139/76   Pulse (!) 59   Temp 97.8 F (36.6 C)   Resp 18   Ht 5\' 3"  (1.6 m)   Wt 113.9 kg   SpO2 100%   BMI 44.46 kg/m  Physical Exam Vitals and nursing note reviewed.  Constitutional:      General: She is not in acute distress.    Appearance: Normal appearance. She is obese.  HENT:     Head: Normocephalic and atraumatic.     Nose: Nose normal.     Mouth/Throat:     Mouth: Mucous membranes are moist.     Pharynx: Oropharynx is clear.  Eyes:     Conjunctiva/sclera: Conjunctivae normal.  Neck:     Comments: No midline neck tenderness Cardiovascular:     Rate and Rhythm: Normal rate and regular rhythm.     Heart sounds: Normal heart sounds.  Pulmonary:     Effort: Pulmonary  effort is normal.     Breath sounds: Normal breath sounds.  Abdominal:     General: Abdomen is flat.     Palpations: Abdomen is soft.     Tenderness: There is no abdominal tenderness.  Musculoskeletal:        General: Normal range of motion.     Cervical back: Normal range of motion and neck supple.     Comments: No midline back tenderness Tenderness to palpation of right lateral hip, no pain with internal/external rotation Tenderness to palpation of right flank, no overlying skin changes Bruising with tenderness to palpation over right bicep, no shoulder or elbow tenderness with full elbow flexion/extension, supination/pronation and full shoulder abduction and flexion without pain No bony tenderness in left upper extremity or left lower extremity  Skin:     General: Skin is warm and dry.  Neurological:     General: No focal deficit present.     Mental Status: She is alert and oriented to person, place, and time.     Sensory: No sensory deficit.     Motor: No weakness.  Psychiatric:        Mood and Affect: Mood normal.        Behavior: Behavior normal.  ED Results / Procedures / Treatments   Labs (all labs ordered are listed, but only abnormal results are displayed) Labs Reviewed - No data to display  EKG None  Radiology No results found.  Procedures Procedures    Medications Ordered in ED Medications  acetaminophen (TYLENOL) tablet 1,000 mg (1,000 mg Oral Given 03/08/23 2116)  ibuprofen (ADVIL) tablet 600 mg (600 mg Oral Given 03/08/23 2115)    ED Course/ Medical Decision Making/ A&P Clinical Course as of 03/13/23 1500  Thu Mar 08, 2023  2132 Xrays negative, Patient is stable for discharge with PCP follow up. [VK]    Clinical Course User Index [VK] Rexford Maus, DO                             Medical Decision Making This patient presents to the ED with chief complaint(s) of fall with pertinent past medical history of HTN which further complicates the presenting complaint. The complaint involves an extensive differential diagnosis and also carries with it a high risk of complications and morbidity.    The differential diagnosis includes hip fracture, rib fracture, pulmonary contusion, sprain, muscle strain  Additional history obtained: Additional history obtained from N/A Records reviewed N/A  ED Course and Reassessment: Patient was initially evaluated by provider in triage and had right hip x-ray and right-sided rib series performed.  X-ray showed no acute traumatic injury.  She was given Tylenol, Motrin and lidocaine patch and was able to ambulate steadily.  No other traumatic injuries seen on exam.  She stable for discharge home with primary care follow-up.  Independent labs interpretation:   N/A  Independent visualization of imaging: - I independently visualized the following imaging with scope of interpretation limited to determining acute life threatening conditions related to emergency care: R hip XR, R-sided rib XR, which revealed no acute traumatic injury  Consultation: - Consulted or discussed management/test interpretation w/ external professional: N/A  Consideration for admission or further workup: Patient has no emergent conditions requiring admission or further work-up at this time and is stable for discharge home with primary care follow-up  Social Determinants of health: N/A    Amount and/or Complexity of Data Reviewed Radiology: ordered.  Risk OTC drugs. Prescription drug management.          Final Clinical Impression(s) / ED Diagnoses Final diagnoses:  Fall, initial encounter  Bruising  Muscular pain    Rx / DC Orders ED Discharge Orders          Ordered    lidocaine (LIDODERM) 5 %  Every 24 hours        03/08/23 2238              Rexford Maus, DO 03/13/23 1500

## 2023-03-13 NOTE — Progress Notes (Signed)
Subjective:  Patient ID: Michelle Barker, female    DOB: [birth year]  Age: 62 y.o. MRN: [id number]  CC: Hypertension and Back Pain   HPI Jakiah Wattley presents for f/up -  She fell at [facility] 5 days ago and complains of pain in her right upper and lower arm and NR LBP.  She was recently seen elsewhere and x-rays of her ribs and pelvis were unremarkable.  Outpatient Medications Prior to Visit  Medication Sig Dispense Refill   lidocaine (LIDODERM) 5 % Place 1 patch onto the skin daily. Remove & Discard patch within 12 hours or as directed by MD 30 patch 0   PARoxetine Mesylate 7.5 MG CAPS paroxetine mesylate (menopausal symptoms suppressant) 7.5 mg capsule     potassium chloride (KLOR-CON 10) 10 MEQ tablet Take 1 tablet (10 mEq total) by mouth daily. 180 tablet 0   prednisoLONE acetate (PRED FORTE) 1 % ophthalmic suspension prednisolone acetate 1 % eye drops,suspension     triamterene-hydrochlorothiazide (DYAZIDE) 37.5-25 MG capsule Take 1 capsule by mouth once daily 90 capsule 1   valACYclovir (VALTREX) 500 MG tablet      meloxicam (MOBIC) 7.5 MG tablet Take 1 tablet by mouth once daily 90 tablet 0   No facility-administered medications prior to visit.    ROS Review of Systems  Constitutional:  Negative for diaphoresis and fatigue.  HENT: Negative.    Eyes: Negative.   Respiratory:  Negative for cough, chest tightness, shortness of breath and wheezing.   Cardiovascular:  Negative for chest pain, palpitations and leg swelling.  Gastrointestinal:  Negative for abdominal pain.  Endocrine: Negative.   Genitourinary:  Negative for difficulty urinating.  Musculoskeletal:  Positive for arthralgias and back pain. Negative for gait problem, joint swelling, myalgias, neck pain and neck stiffness.  Neurological:  Negative for dizziness, weakness and light-headedness.  Hematological:  Negative for adenopathy. Does not bruise/bleed easily.  Psychiatric/Behavioral: Negative.  The  patient is not nervous/anxious and is not hyperactive.     Objective:  BP 120/78 (BP Location: Left Arm, Patient Position: Sitting, Cuff Size: Large)   Pulse 78   Temp 99 F (37.2 C) (Oral)   Resp 16   Ht 5\' 3"  (1.6 m)   Wt 229 lb (103.9 kg)   SpO2 98%   BMI 40.57 kg/m   BP Readings from Last 3 Encounters:  03/13/23 120/78  03/08/23 139/76  09/21/22 124/82    Wt Readings from Last 3 Encounters:  03/13/23 229 lb (103.9 kg)  03/08/23 251 lb (113.9 kg)  09/21/22 226 lb (102.5 kg)    Physical Exam Vitals reviewed.  Constitutional:      Appearance: She is obese. She is not ill-appearing.  HENT:     Nose: Nose normal.     Mouth/Throat:     Mouth: Mucous membranes are moist.  Eyes:     Conjunctiva/sclera: Conjunctivae normal.  Cardiovascular:     Rate and Rhythm: Normal rate and regular rhythm.     Heart sounds: No murmur heard. Pulmonary:     Breath sounds: No stridor. No wheezing or rhonchi.  Abdominal:     General: Abdomen is flat.     Palpations: There is no mass.     Tenderness: There is no abdominal tenderness. There is no guarding.     Hernia: No hernia is present.  Musculoskeletal:        General: Tenderness and signs of injury present. No swelling or deformity.     Right shoulder: Tenderness  present. No swelling. Normal range of motion.     Left shoulder: Normal. No swelling. Normal range of motion.     Right upper arm: Normal. No swelling, deformity, tenderness or bony tenderness.     Left upper arm: Normal.     Right elbow: Normal. No swelling, deformity or effusion. Normal range of motion. No tenderness.     Left elbow: Normal.     Right forearm: Swelling and tenderness present. No bony tenderness. Deformity: faint ecchymosis.    Right wrist: Normal. No swelling, deformity or effusion. Normal range of motion.     Left wrist: Normal. No swelling, deformity or effusion. Normal range of motion.     Cervical back: Neck supple.     Thoracic back: Normal  range of motion.     Lumbar back: Tenderness present. No swelling, edema, signs of trauma, spasms or bony tenderness. Normal range of motion. Negative right straight leg raise test and negative left straight leg raise test.     Right lower leg: No edema.     Left lower leg: No edema.  Lymphadenopathy:     Cervical: No cervical adenopathy.  Skin:    General: Skin is warm and dry.     Findings: Bruising present.  Neurological:     General: No focal deficit present.     Mental Status: She is alert. Mental status is at baseline.  Psychiatric:        Mood and Affect: Mood normal.     Lab Results  Component Value Date   WBC 3.7 (L) 09/21/2022   HGB 14.9 09/21/2022   HCT 45.0 09/21/2022   PLT 274.0 09/21/2022   GLUCOSE 89 09/21/2022   CHOL 258 (H) 11/17/2021   TRIG 81.0 11/17/2021   HDL 78.70 11/17/2021   LDLCALC 163 (H) 11/17/2021   ALT 18 11/17/2021   AST 25 11/17/2021   NA 137 09/21/2022   K 3.6 09/21/2022   CL 98 09/21/2022   CREATININE 0.96 09/21/2022   BUN 12 09/21/2022   CO2 31 09/21/2022   TSH 1.94 11/17/2021   HGBA1C 5.8 03/06/2016    DG Ribs Unilateral W/Chest Right  Result Date: 03/08/2023 CLINICAL DATA:  Larey Seat, right rib pain EXAM: RIGHT RIBS AND CHEST - 3+ VIEW COMPARISON:  10/30/2018 FINDINGS: Frontal view of the chest as well as frontal and oblique views of the right thoracic cage are obtained. Cardiac silhouette is enlarged but stable. No airspace disease, effusion, or pneumothorax. There are no acute displaced rib fractures. IMPRESSION: 1. No acute intrathoracic process. 2. No displaced rib fracture. Electronically Signed   By: Sharlet Salina M.D.   On: 03/08/2023 21:05   DG Hip Unilat  With Pelvis 2-3 Views Right  Result Date: 03/08/2023 CLINICAL DATA:  Larey Seat, right hip pain EXAM: DG HIP (WITH OR WITHOUT PELVIS) 2-3V RIGHT COMPARISON:  None Available. FINDINGS: Frontal view of the pelvis as well as frontal and frogleg lateral views of the right hip are obtained.  There are no acute displaced fractures. Alignment is anatomic. Mild symmetrical bilateral hip osteoarthritis. Sacroiliac joints are normal. IMPRESSION: 1. Unremarkable pelvis and right hip. Electronically Signed   By: Sharlet Salina M.D.   On: 03/08/2023 21:04   No results found.   Assessment & Plan:   Hypokalemia -     Basic metabolic panel; Future  Essential hypertension- Her BP is well controlled. -     Basic metabolic panel; Future  Lower back injury, initial encounter -  DG Lumbar Spine Complete; Future  Injury of multiple sites of right upper arm, initial encounter -     DG Humerus Right; Future  Right forearm injury, initial encounter -     DG Forearm Right; Future  Primary osteoarthritis of both knees -     Meloxicam; Take 1 tablet (7.5 mg total) by mouth daily.  Dispense: 90 tablet; Refill: 0 -     HYDROcodone-Acetaminophen; Take 1 tablet by mouth every 6 (six) hours as needed for up to 5 days for moderate pain.  Dispense: 20 tablet; Refill: 0  Acute bilateral low back pain with bilateral sciatica- Plain films are negative and she is neurologically intact.  Will treat with an NSAID, hydrocodone, and acetaminophen. -     Meloxicam; Take 1 tablet (7.5 mg total) by mouth daily.  Dispense: 90 tablet; Refill: 0 -     HYDROcodone-Acetaminophen; Take 1 tablet by mouth every 6 (six) hours as needed for up to 5 days for moderate pain.  Dispense: 20 tablet; Refill: 0     Follow-up: Return in about 3 months (around 06/12/2023).  Sanda Linger, MD

## 2023-03-15 ENCOUNTER — Encounter: Payer: Self-pay | Admitting: Internal Medicine

## 2023-03-15 DIAGNOSIS — M5441 Lumbago with sciatica, right side: Secondary | ICD-10-CM | POA: Insufficient documentation

## 2023-03-15 DIAGNOSIS — M5442 Lumbago with sciatica, left side: Secondary | ICD-10-CM | POA: Insufficient documentation

## 2023-03-15 MED ORDER — MELOXICAM 7.5 MG PO TABS: 7.5000 mg | ORAL_TABLET | Freq: Every day | ORAL | 0 refills | Status: DC

## 2023-03-15 MED ORDER — HYDROCODONE-ACETAMINOPHEN 5-325 MG PO TABS
1.0000 | ORAL_TABLET | Freq: Four times a day (QID) | ORAL | 0 refills | Status: AC | PRN
Start: 2023-03-15 — End: 2023-03-20

## 2023-03-19 ENCOUNTER — Telehealth (HOSPITAL_COMMUNITY): Payer: Self-pay | Admitting: Pharmacy Technician

## 2023-03-19 ENCOUNTER — Other Ambulatory Visit (HOSPITAL_COMMUNITY): Payer: Self-pay

## 2023-03-19 ENCOUNTER — Encounter: Payer: Self-pay | Admitting: Internal Medicine

## 2023-03-19 ENCOUNTER — Other Ambulatory Visit: Payer: Self-pay | Admitting: Internal Medicine

## 2023-03-19 DIAGNOSIS — G8929 Other chronic pain: Secondary | ICD-10-CM

## 2023-03-19 DIAGNOSIS — M5441 Lumbago with sciatica, right side: Secondary | ICD-10-CM

## 2023-03-19 NOTE — Telephone Encounter (Signed)
Patient Advocate Encounter   Received notification that prior authorization for Lidocaine 5% patches is required.   PA submitted on 03/19/2023 [facility]  Insurance [employer] Electronic Prior Authorization Form Status is pending       Roland Earl, CPhT Pharmacy Patient Advocate Specialist [facility] Patient Advocate Team Direct Number: [phone]  Fax: [phone]

## 2023-03-19 NOTE — Telephone Encounter (Signed)
Patient Advocate Encounter  Prior Authorization for Lidocaine 5% patches  has been approved.    PA# [id number] Insurance [employer] Electronic Prior Authorization Form  Effective dates: 03/19/2023 through 09/19/2023  Patients co-pay is $48.41.     Roland Earl, CPhT Pharmacy Patient Advocate Specialist [facility] Patient Advocate Team Direct Number: [phone]  Fax: [phone]

## 2023-03-20 NOTE — Progress Notes (Deleted)
    Aleen Sells D.[name] [address] [place] [address] Phone: [phone]   Assessment and Plan:     There are no diagnoses linked to this encounter.  ***   Pertinent previous records reviewed include ***   Follow Up: ***     Subjective:   I, Michelle Barker, am serving as a Neurosurgeon for Doctor Richardean Sale  Chief Complaint: low back and right knee   HPI:   03/21/2023 Patient is a 62 year old female complaining of low back and right knee pain. Patient states  Relevant Historical Information: ***  Additional pertinent review of systems negative.   Current Outpatient Medications:    HYDROcodone-acetaminophen (NORCO/VICODIN) 5-325 MG tablet, Take 1 tablet by mouth every 6 (six) hours as needed for up to 5 days for moderate pain., Disp: 20 tablet, Rfl: 0   lidocaine (LIDODERM) 5 %, Place 1 patch onto the skin daily. Remove & Discard patch within 12 hours or as directed by MD, Disp: 30 patch, Rfl: 0   meloxicam (MOBIC) 7.5 MG tablet, Take 1 tablet (7.5 mg total) by mouth daily., Disp: 90 tablet, Rfl: 0   PARoxetine Mesylate 7.5 MG CAPS, paroxetine mesylate (menopausal symptoms suppressant) 7.5 mg capsule, Disp: , Rfl:    potassium chloride (KLOR-CON 10) 10 MEQ tablet, Take 1 tablet (10 mEq total) by mouth daily., Disp: 180 tablet, Rfl: 0   prednisoLONE acetate (PRED FORTE) 1 % ophthalmic suspension, prednisolone acetate 1 % eye drops,suspension, Disp: , Rfl:    triamterene-hydrochlorothiazide (DYAZIDE) 37.5-25 MG capsule, Take 1 capsule by mouth once daily, Disp: 90 capsule, Rfl: 1   valACYclovir (VALTREX) 500 MG tablet, , Disp: , Rfl:    Objective:     There were no vitals filed for this visit.    There is no height or weight on file to calculate BMI.    Physical Exam:    ***   Electronically signed by:  Aleen Sells D.[name] 7:31 AM 03/20/23

## 2023-03-21 ENCOUNTER — Ambulatory Visit: Payer: 59 | Admitting: Sports Medicine

## 2023-03-22 ENCOUNTER — Ambulatory Visit: Payer: 59 | Admitting: Sports Medicine

## 2023-03-22 VITALS — BP 132/82 | HR 82 | Ht 63.0 in | Wt 226.0 lb

## 2023-03-22 DIAGNOSIS — G8929 Other chronic pain: Secondary | ICD-10-CM

## 2023-03-22 DIAGNOSIS — M545 Low back pain, unspecified: Secondary | ICD-10-CM | POA: Diagnosis not present

## 2023-03-22 DIAGNOSIS — M25561 Pain in right knee: Secondary | ICD-10-CM | POA: Diagnosis not present

## 2023-03-22 DIAGNOSIS — M25511 Pain in right shoulder: Secondary | ICD-10-CM

## 2023-03-22 NOTE — Patient Instructions (Addendum)
Good to see you HEP  - Start meloxicam 15 mg daily x2 weeks.  If still having pain after 2 weeks, complete 3rd-week of meloxicam. May use remaining meloxicam as needed once daily for pain control.  Do not to use additional NSAIDs while taking meloxicam.  May use Tylenol [phone] mg 2 to 3 times a day for breakthrough pain. Call us if you need a refill  4 week follow up

## 2023-03-22 NOTE — Progress Notes (Signed)
Michelle Barker D[name] [address] [place] [address] Phone: [phone]   Assessment and Plan:     1. Acute bilateral low back pain without sciatica -Acute, initial sports medicine visit 2. Chronic pain of right knee -Chronic with exacerbation, initial sports medicine visit 3. Acute pain of right shoulder  -Acute, initial sports medicine visit  - Patient presents today with multiple musculoskeletal complaints that are primarily acute after a fall 2 weeks ago.  Patient's right knee pain is more chronic with an acute flare after fall.  No red flag signs on physical exam and no abnormality seen on x-rays upon review - Start meloxicam 15 mg daily x2 weeks.  If still having pain after 2 weeks, complete 3rd-week of meloxicam. May use remaining meloxicam as needed once daily for pain control.  Do not to use additional NSAIDs while taking meloxicam.  May use Tylenol [phone] mg 2 to 3 times a day for breakthrough pain.  Patient already has a prescription, but may call our clinic for refill if needed -Start HEP for rotator cuff, low back, knee  Pertinent previous records reviewed include x-ray hip and ribs from 03/08/2023, x-ray lumbar spine 03/13/2023, x-ray humerus 03/13/2023, x-ray forearm 03/13/23, internal medicine note 03/13/2023, ER note 03/08/2023   Follow Up: 4 weeks for reevaluation.  Would discontinue meloxicam at that time.  Could consider CSI versus physical therapy if no improving or worsening of symptoms   Subjective:   I, Michelle Barker, am serving as a Neurosurgeon for Doctor [employer]  Chief Complaint: low back and right knee pain   HPI:   03/22/23 Patient is a 62 year old female complaining of low back and right knee pain. Patient states 2 weeks ago that she was in the store shortly prior to arrival and they had just mopped the floors when she slipped and fell landing on her right side. She states that she is having pain all the  way down her right side into her right hip.  Right sided body pain ,   Arm - is sore feels like a bruise, decreased ROM, is able to do daily ADLS, intermittent numbness and tingling , ibu and tylenol and that helps a lot   Low back aches around the spine intermittent pain , has a hard time moving around, intermittent radiating pain down the back , not able to sit for long periods of time   Right knee pain when walking up the steps, has to move her leg constantly, she did slide on her knee when she fell, no radiating pain, no numbness or tingling , whole knee pain   Relevant Historical Information: Hypertension  Additional pertinent review of systems negative.   Current Outpatient Medications:    lidocaine (LIDODERM) 5 %, Place 1 patch onto the skin daily. Remove & Discard patch within 12 hours or as directed by MD, Disp: 30 patch, Rfl: 0   meloxicam (MOBIC) 7.5 MG tablet, Take 1 tablet (7.5 mg total) by mouth daily., Disp: 90 tablet, Rfl: 0   PARoxetine Mesylate 7.5 MG CAPS, paroxetine mesylate (menopausal symptoms suppressant) 7.5 mg capsule, Disp: , Rfl:    potassium chloride (KLOR-CON 10) 10 MEQ tablet, Take 1 tablet (10 mEq total) by mouth daily., Disp: 180 tablet, Rfl: 0   prednisoLONE acetate (PRED FORTE) 1 % ophthalmic suspension, prednisolone acetate 1 % eye drops,suspension, Disp: , Rfl:    triamterene-hydrochlorothiazide (DYAZIDE) 37.5-25 MG capsule, Take 1 capsule by mouth once  daily, Disp: 90 capsule, Rfl: 1   valACYclovir (VALTREX) 500 MG tablet, , Disp: , Rfl:    Objective:     Vitals:   03/22/23 1124  BP: 132/82  Pulse: 82  SpO2: 99%  Weight: 226 lb (102.5 kg)  Height: 5\' 3"  (1.6 m)      Body mass index is 40.03 kg/m.    Physical Exam:    Gen: Appears well, nad, nontoxic and pleasant Psych: Alert and oriented, appropriate mood and affect Neuro: sensation intact, strength is 5/5 in upper and lower extremities, muscle tone wnl Skin: no susupicious lesions or  rashes  Back - Normal skin, Spine with normal alignment and no deformity.   No tenderness to vertebral process palpation.   Right lumbar paraspinous muscles are    tender and without spasm  TTP right gluteal musculature Straight leg raise negative   Piriformis Test positive for pain on right, though negative for radicular symptoms  Gen: Appears well, nad, nontoxic and pleasant Neuro:sensation intact, strength is 5/5 with df/pf/inv/ev, muscle tone wnl Skin: no suspicious lesion or defmority Psych: A&O, appropriate mood and affect  Right shoulder:  No deformity, swelling or muscle wasting No scapular winging FF 90, abd 90, int 20, ext 80 Special testing limited due to painful ROM  Right knee: No deformity, effusion Flexion 100, extension 0 TTP   medial joint line Negative [place]  Electronically signed by:  Michelle [name] 11:46 AM 03/22/23

## 2023-04-04 ENCOUNTER — Encounter: Payer: Self-pay | Admitting: Internal Medicine

## 2023-04-11 NOTE — Progress Notes (Signed)
Michelle [facility] D.[facility] [address] [place] [address] Phone: [phone]   Assessment and Plan:     1. Chronic right shoulder pain -Chronic with exacerbation, not improving, subsequent visit - Patient feels that her right shoulder pain is not improving despite NSAID course, HEP, unremarkable x-ray imaging - Patient elected to proceed with MRI of right shoulder to further evaluate as she has not had benefit with >6 weeks with conservative therapy, pain frequently >6/10, and pain affecting day-to-day activity - Complete current meloxicam course.  Afterwards discontinue daily use and instead may use meloxicam as needed for breakthrough pain.  Recommend not using NSAIDs more frequently than 1-2 times per week.  May use Tylenol for day-to-day pain relief - Continue HEP  2. Chronic pain of right knee - Chronic with exacerbation, improving, subsequent visit 3. Acute bilateral low back pain without sciatica -Acute, improving, subsequent visit - Knee and low back pain have overall improved with 3-week course of meloxicam and HEP.  Patient does have some intermittent pain, though overall does feel significant benefit from medication and HEP - Continue HEP - Complete current meloxicam course.  Afterwards discontinue daily use and instead may use meloxicam as needed for breakthrough pain.  Recommend not using NSAIDs more frequently than 1-2 times per week.  May use Tylenol for day-to-day pain relief  Other orders - meloxicam (MOBIC) 15 MG tablet; Take 1 tablet (15 mg total) by mouth daily.    Pertinent previous records reviewed include none   Follow Up: 4 days after MRI to review results and discuss treatment plan   Subjective:   I, Michelle Barker, am serving as a Neurosurgeon for Doctor Richardean Sale   Chief Complaint: low back and right knee pain    HPI:    03/22/23 Patient is a 62 year old female complaining of low back and right knee pain.  Patient states 2 weeks ago that she was in the store shortly prior to arrival and they had just mopped the floors when she slipped and fell landing on her right side. She states that she is having pain all the way down her right side into her right hip.  Right sided body pain ,    Arm - is sore feels like a bruise, decreased ROM, is able to do daily ADLS, intermittent numbness and tingling , ibu and tylenol and that helps a lot    Low back aches around the spine intermittent pain , has a hard time moving around, intermittent radiating pain down the back , not able to sit for long periods of time    Right knee pain when walking up the steps, has to move her leg constantly, she did slide on her knee when she fell, no radiating pain, no numbness or tingling , whole knee pain   04/19/2023 Patient states that she is the same , a little improvement     Relevant Historical Information: Hypertension  Additional pertinent review of systems negative.   Current Outpatient Medications:    lidocaine (LIDODERM) 5 %, Place 1 patch onto the skin daily. Remove & Discard patch within 12 hours or as directed by MD, Disp: 30 patch, Rfl: 0   meloxicam (MOBIC) 15 MG tablet, Take 1 tablet (15 mg total) by mouth daily., Disp: 30 tablet, Rfl: 0   meloxicam (MOBIC) 7.5 MG tablet, Take 1 tablet (7.5 mg total) by mouth daily., Disp: 90 tablet, Rfl: 0   PARoxetine Mesylate 7.5 MG  CAPS, paroxetine mesylate (menopausal symptoms suppressant) 7.5 mg capsule, Disp: , Rfl:    potassium chloride (KLOR-CON 10) 10 MEQ tablet, Take 1 tablet (10 mEq total) by mouth daily., Disp: 180 tablet, Rfl: 0   prednisoLONE acetate (PRED FORTE) 1 % ophthalmic suspension, prednisolone acetate 1 % eye drops,suspension, Disp: , Rfl:    triamterene-hydrochlorothiazide (DYAZIDE) 37.5-25 MG capsule, Take 1 capsule by mouth once daily, Disp: 90 capsule, Rfl: 1   valACYclovir (VALTREX) 500 MG tablet, , Disp: , Rfl:    Objective:     Vitals:    04/19/23 1532  Pulse: 65  SpO2: 97%  Weight: 230 lb (104.3 kg)  Height: 5\' 3"  (1.6 m)      Body mass index is 40.74 kg/m.    Physical Exam:     Right shoulder:  No deformity, swelling or muscle wasting No scapular winging FF 90, abd 90, int 20, ext 80 Special testing limited due to painful ROM    Electronically signed by:  Michelle Barker [facility] 4:14 PM 04/19/23

## 2023-04-19 ENCOUNTER — Ambulatory Visit: Payer: 59 | Admitting: Sports Medicine

## 2023-04-19 VITALS — HR 65 | Ht 63.0 in | Wt 230.0 lb

## 2023-04-19 DIAGNOSIS — M545 Low back pain, unspecified: Secondary | ICD-10-CM | POA: Diagnosis not present

## 2023-04-19 DIAGNOSIS — M25561 Pain in right knee: Secondary | ICD-10-CM | POA: Diagnosis not present

## 2023-04-19 DIAGNOSIS — G8929 Other chronic pain: Secondary | ICD-10-CM

## 2023-04-19 DIAGNOSIS — M25511 Pain in right shoulder: Secondary | ICD-10-CM

## 2023-04-19 MED ORDER — MELOXICAM 15 MG PO TABS
15.0000 mg | ORAL_TABLET | Freq: Every day | ORAL | 0 refills | Status: DC
Start: 2023-04-19 — End: 2023-06-14

## 2023-04-19 NOTE — Patient Instructions (Signed)
Right shoulder MRI  Stp meloxicam daily may use remainder as needed no more than 1-2 times per week  Tylenol [phone] mg 2-3 times a day for pain relief  Follow up 4 days after MRI to discuss results

## 2023-04-24 ENCOUNTER — Encounter: Payer: Self-pay | Admitting: Sports Medicine

## 2023-05-02 ENCOUNTER — Other Ambulatory Visit: Payer: Self-pay | Admitting: Internal Medicine

## 2023-05-02 DIAGNOSIS — I10 Essential (primary) hypertension: Secondary | ICD-10-CM

## 2023-05-02 DIAGNOSIS — E876 Hypokalemia: Secondary | ICD-10-CM

## 2023-05-05 ENCOUNTER — Ambulatory Visit (INDEPENDENT_AMBULATORY_CARE_PROVIDER_SITE_OTHER): Payer: 59

## 2023-05-05 DIAGNOSIS — G8929 Other chronic pain: Secondary | ICD-10-CM

## 2023-05-05 DIAGNOSIS — M25511 Pain in right shoulder: Secondary | ICD-10-CM

## 2023-05-14 ENCOUNTER — Encounter: Payer: Self-pay | Admitting: Sports Medicine

## 2023-05-14 ENCOUNTER — Encounter: Payer: Self-pay | Admitting: Internal Medicine

## 2023-05-14 NOTE — Telephone Encounter (Signed)
Left VM for patient to make a follow up appointment

## 2023-05-14 NOTE — Telephone Encounter (Signed)
Left VM and mychart message to follow up in clinic

## 2023-05-21 NOTE — Progress Notes (Deleted)
    Michelle [name] [address] [place] [address] Phone: [phone]   Assessment and Plan:     There are no diagnoses linked to this encounter.  ***   Pertinent previous records reviewed include ***   Follow Up: ***     Subjective:   I, Michelle Barker, am serving as a Neurosurgeon for Doctor Richardean Sale   Chief Complaint: low back and right knee pain    HPI:    03/22/23 Patient is a 62 year old female complaining of low back and right knee pain. Patient states 2 weeks ago that she was in the store shortly prior to arrival and they had just mopped the floors when she slipped and fell landing on her right side. She states that she is having pain all the way down her right side into her right hip.  Right sided body pain ,    Arm - is sore feels like a bruise, decreased ROM, is able to do daily ADLS, intermittent numbness and tingling , ibu and tylenol and that helps a lot    Low back aches around the spine intermittent pain , has a hard time moving around, intermittent radiating pain down the back , not able to sit for long periods of time    Right knee pain when walking up the steps, has to move her leg constantly, she did slide on her knee when she fell, no radiating pain, no numbness or tingling , whole knee pain    04/19/2023 Patient states that she is the same , a little improvement    05/22/2023 Patient states    Relevant Historical Information: Hypertension  Additional pertinent review of systems negative.   Current Outpatient Medications:    lidocaine (LIDODERM) 5 %, Place 1 patch onto the skin daily. Remove & Discard patch within 12 hours or as directed by MD, Disp: 30 patch, Rfl: 0   meloxicam (MOBIC) 15 MG tablet, Take 1 tablet (15 mg total) by mouth daily., Disp: 30 tablet, Rfl: 0   meloxicam (MOBIC) 7.5 MG tablet, Take 1 tablet (7.5 mg total) by mouth daily., Disp: 90 tablet, Rfl: 0   PARoxetine Mesylate 7.5  MG CAPS, paroxetine mesylate (menopausal symptoms suppressant) 7.5 mg capsule, Disp: , Rfl:    potassium chloride (KLOR-CON 10) 10 MEQ tablet, Take 1 tablet (10 mEq total) by mouth daily., Disp: 180 tablet, Rfl: 0   prednisoLONE acetate (PRED FORTE) 1 % ophthalmic suspension, prednisolone acetate 1 % eye drops,suspension, Disp: , Rfl:    triamterene-hydrochlorothiazide (DYAZIDE) 37.5-25 MG capsule, Take 1 capsule by mouth once daily, Disp: 90 capsule, Rfl: 0   valACYclovir (VALTREX) 500 MG tablet, , Disp: , Rfl:    Objective:     There were no vitals filed for this visit.    There is no height or weight on file to calculate BMI.    Physical Exam:    ***   Electronically signed by:  Michelle [name] 7:33 AM 05/21/23

## 2023-05-22 ENCOUNTER — Ambulatory Visit: Payer: 59 | Admitting: Sports Medicine

## 2023-05-29 ENCOUNTER — Other Ambulatory Visit: Payer: Self-pay | Admitting: Sports Medicine

## 2023-05-29 NOTE — Progress Notes (Deleted)
    Aleen Sells D.[name] [address] [place] [address] Phone: [phone]   Assessment and Plan:     There are no diagnoses linked to this encounter.  ***   Pertinent previous records reviewed include ***   Follow Up: ***     Subjective:   I,  , am serving as a Neurosurgeon for Doctor Richardean Sale   Chief Complaint: low back and right knee pain    HPI:    03/22/23 Patient is a 62 year old female complaining of low back and right knee pain. Patient states 2 weeks ago that she was in the store shortly prior to arrival and they had just mopped the floors when she slipped and fell landing on her right side. She states that she is having pain all the way down her right side into her right hip.  Right sided body pain ,    Arm - is sore feels like a bruise, decreased ROM, is able to do daily ADLS, intermittent numbness and tingling , ibu and tylenol and that helps a lot    Low back aches around the spine intermittent pain , has a hard time moving around, intermittent radiating pain down the back , not able to sit for long periods of time    Right knee pain when walking up the steps, has to move her leg constantly, she did slide on her knee when she fell, no radiating pain, no numbness or tingling , whole knee pain    04/19/2023 Patient states that she is the same , a little improvement    05/30/2023 Patient states    Relevant Historical Information: Hypertension  Additional pertinent review of systems negative.   Current Outpatient Medications:    lidocaine (LIDODERM) 5 %, Place 1 patch onto the skin daily. Remove & Discard patch within 12 hours or as directed by MD, Disp: 30 patch, Rfl: 0   meloxicam (MOBIC) 15 MG tablet, Take 1 tablet (15 mg total) by mouth daily., Disp: 30 tablet, Rfl: 0   meloxicam (MOBIC) 7.5 MG tablet, Take 1 tablet (7.5 mg total) by mouth daily., Disp: 90 tablet, Rfl: 0   PARoxetine Mesylate 7.5  MG CAPS, paroxetine mesylate (menopausal symptoms suppressant) 7.5 mg capsule, Disp: , Rfl:    potassium chloride (KLOR-CON 10) 10 MEQ tablet, Take 1 tablet (10 mEq total) by mouth daily., Disp: 180 tablet, Rfl: 0   prednisoLONE acetate (PRED FORTE) 1 % ophthalmic suspension, prednisolone acetate 1 % eye drops,suspension, Disp: , Rfl:    triamterene-hydrochlorothiazide (DYAZIDE) 37.5-25 MG capsule, Take 1 capsule by mouth once daily, Disp: 90 capsule, Rfl: 0   valACYclovir (VALTREX) 500 MG tablet, , Disp: , Rfl:    Objective:     There were no vitals filed for this visit.    There is no height or weight on file to calculate BMI.    Physical Exam:    ***   Electronically signed by:  Aleen Sells [name] 7:19 AM 05/29/23

## 2023-05-30 ENCOUNTER — Ambulatory Visit: Payer: 59 | Admitting: Sports Medicine

## 2023-05-31 ENCOUNTER — Ambulatory Visit: Payer: 59 | Admitting: Sports Medicine

## 2023-05-31 VITALS — HR 78 | Ht 63.0 in | Wt 234.0 lb

## 2023-05-31 DIAGNOSIS — M25511 Pain in right shoulder: Secondary | ICD-10-CM | POA: Diagnosis not present

## 2023-05-31 DIAGNOSIS — M19011 Primary osteoarthritis, right shoulder: Secondary | ICD-10-CM | POA: Diagnosis not present

## 2023-05-31 DIAGNOSIS — M7581 Other shoulder lesions, right shoulder: Secondary | ICD-10-CM | POA: Diagnosis not present

## 2023-05-31 DIAGNOSIS — G8929 Other chronic pain: Secondary | ICD-10-CM | POA: Diagnosis not present

## 2023-05-31 NOTE — Patient Instructions (Signed)
3 week follow up.

## 2023-05-31 NOTE — Progress Notes (Signed)
Aleen Sells [name] [address] [place] [address] Phone: [phone]   Assessment and Plan:     1. Chronic right shoulder pain 2. Primary osteoarthritis of right shoulder 3. Rotator cuff tendinitis, right  -Chronic with exacerbation, worsening, subsequent visit - Continued right shoulder pain that is progressively worsening - Reviewed patient's MRI which showed mild supraspinatus tendinitis, mild infraspinatus tendinitis, moderate long head biceps tendinitis, moderate glenohumeral osteoarthritis - Based off of patient's HPI and physical exam, I believe the tendinopathy's found on MRI are the primary cause of patient's ongoing pain.  Patient elected for subacromial CSI.  Tolerated well per note below - Discontinue meloxicam - May use Tylenol for day-to-day pain relief - Continue HEP  Procedure: Subacromial Injection Side: Right  Risks explained and consent was given verbally. The site was cleaned with alcohol prep. A steroid injection was performed from posterior approach using 4 mL of 1% lidocaine without epinephrine and 2 mL of kenalog 40mg /ml. This was well tolerated and resulted in symptomatic relief.  Needle was removed, hemostasis achieved, and post injection instructions were explained.   Pt was advised to call or return to clinic if these symptoms worsen or fail to improve as anticipated.   Pertinent previous records reviewed include right shoulder MRI 05/05/2023   Follow Up: 3 weeks for reevaluation.  If no improvement or worsening of symptoms, could consider intra-articular shoulder CSI   Subjective:   I, Moenique Parris, am serving as a Neurosurgeon for Doctor Richardean Sale   Chief Complaint: low back and right knee pain    HPI:    03/22/23 Patient is a 62 year old female complaining of low back and right knee pain. Patient states 2 weeks ago that she was in the store shortly prior to arrival and they had just mopped the  floors when she slipped and fell landing on her right side. She states that she is having pain all the way down her right side into her right hip.  Right sided body pain ,    Arm - is sore feels like a bruise, decreased ROM, is able to do daily ADLS, intermittent numbness and tingling , ibu and tylenol and that helps a lot    Low back aches around the spine intermittent pain , has a hard time moving around, intermittent radiating pain down the back , not able to sit for long periods of time    Right knee pain when walking up the steps, has to move her leg constantly, she did slide on her knee when she fell, no radiating pain, no numbness or tingling , whole knee pain    04/19/2023 Patient states that she is the same , a little improvement    05/31/2023 Patient states she is in a lot of pain with that shoulder     Relevant Historical Information: Hypertension  Additional pertinent review of systems negative.   Current Outpatient Medications:    lidocaine (LIDODERM) 5 %, Place 1 patch onto the skin daily. Remove & Discard patch within 12 hours or as directed by MD, Disp: 30 patch, Rfl: 0   meloxicam (MOBIC) 15 MG tablet, Take 1 tablet (15 mg total) by mouth daily., Disp: 30 tablet, Rfl: 0   meloxicam (MOBIC) 7.5 MG tablet, Take 1 tablet (7.5 mg total) by mouth daily., Disp: 90 tablet, Rfl: 0   PARoxetine Mesylate 7.5 MG CAPS, paroxetine mesylate (menopausal symptoms suppressant) 7.5 mg capsule, Disp: , Rfl:  potassium chloride (KLOR-CON 10) 10 MEQ tablet, Take 1 tablet (10 mEq total) by mouth daily., Disp: 180 tablet, Rfl: 0   prednisoLONE acetate (PRED FORTE) 1 % ophthalmic suspension, prednisolone acetate 1 % eye drops,suspension, Disp: , Rfl:    triamterene-hydrochlorothiazide (DYAZIDE) 37.5-25 MG capsule, Take 1 capsule by mouth once daily, Disp: 90 capsule, Rfl: 0   valACYclovir (VALTREX) 500 MG tablet, , Disp: , Rfl:    Objective:     Vitals:   05/31/23 1519  Pulse: 78  SpO2:  98%  Weight: 234 lb (106.1 kg)  Height: 5\' 3"  (1.6 m)      Body mass index is 41.45 kg/m.    Physical Exam:    Gen: Appears well, nad, nontoxic and pleasant Neuro:sensation intact, strength is 5/5 with df/pf/inv/ev, muscle tone wnl Skin: no suspicious lesion or defmority Psych: A&O, appropriate mood and affect   Right shoulder:  No deformity, swelling or muscle wasting No scapular winging FF 90, abd 90, int 20, ext 80 Special testing limited due to painful ROM    Electronically signed by:  Aleen Sells [name] 3:27 PM 05/31/23

## 2023-06-11 ENCOUNTER — Encounter: Payer: Self-pay | Admitting: Internal Medicine

## 2023-06-13 ENCOUNTER — Encounter (INDEPENDENT_AMBULATORY_CARE_PROVIDER_SITE_OTHER): Payer: Self-pay

## 2023-06-14 ENCOUNTER — Encounter: Payer: Self-pay | Admitting: Internal Medicine

## 2023-06-14 ENCOUNTER — Ambulatory Visit (INDEPENDENT_AMBULATORY_CARE_PROVIDER_SITE_OTHER): Payer: 59

## 2023-06-14 ENCOUNTER — Ambulatory Visit: Payer: 59 | Admitting: Internal Medicine

## 2023-06-14 VITALS — BP 120/80 | HR 91 | Temp 98.0°F | Resp 16 | Ht 63.0 in | Wt 223.0 lb

## 2023-06-14 DIAGNOSIS — Z0001 Encounter for general adult medical examination with abnormal findings: Secondary | ICD-10-CM | POA: Diagnosis not present

## 2023-06-14 DIAGNOSIS — R058 Other specified cough: Secondary | ICD-10-CM | POA: Insufficient documentation

## 2023-06-14 DIAGNOSIS — Z1231 Encounter for screening mammogram for malignant neoplasm of breast: Secondary | ICD-10-CM | POA: Insufficient documentation

## 2023-06-14 DIAGNOSIS — E7841 Elevated Lipoprotein(a): Secondary | ICD-10-CM

## 2023-06-14 DIAGNOSIS — Z23 Encounter for immunization: Secondary | ICD-10-CM

## 2023-06-14 DIAGNOSIS — E785 Hyperlipidemia, unspecified: Secondary | ICD-10-CM | POA: Diagnosis not present

## 2023-06-14 DIAGNOSIS — J208 Acute bronchitis due to other specified organisms: Secondary | ICD-10-CM | POA: Diagnosis not present

## 2023-06-14 DIAGNOSIS — I10 Essential (primary) hypertension: Secondary | ICD-10-CM | POA: Diagnosis not present

## 2023-06-14 DIAGNOSIS — U071 COVID-19: Secondary | ICD-10-CM

## 2023-06-14 DIAGNOSIS — N1831 Chronic kidney disease, stage 3a: Secondary | ICD-10-CM

## 2023-06-14 DIAGNOSIS — Z0000 Encounter for general adult medical examination without abnormal findings: Secondary | ICD-10-CM

## 2023-06-14 LAB — CBC WITH DIFFERENTIAL/PLATELET
Basophils Absolute: 0 10*3/uL (ref 0.0–0.1)
Basophils Relative: 0.8 % (ref 0.0–3.0)
Eosinophils Absolute: 0 10*3/uL (ref 0.0–0.7)
Eosinophils Relative: 0.1 % (ref 0.0–5.0)
HCT: 48.9 % — ABNORMAL HIGH (ref 36.0–46.0)
Hemoglobin: 15.9 g/dL — ABNORMAL HIGH (ref 12.0–15.0)
Lymphocytes Relative: 22.2 % (ref 12.0–46.0)
Lymphs Abs: 1.1 10*3/uL (ref 0.7–4.0)
MCHC: 32.5 g/dL (ref 30.0–36.0)
MCV: 91 fl (ref 78.0–100.0)
Monocytes Absolute: 0.8 10*3/uL (ref 0.1–1.0)
Monocytes Relative: 16.3 % — ABNORMAL HIGH (ref 3.0–12.0)
Neutro Abs: 3.1 10*3/uL (ref 1.4–7.7)
Neutrophils Relative %: 60.6 % (ref 43.0–77.0)
Platelets: 276 10*3/uL (ref 150.0–400.0)
RBC: 5.37 Mil/uL — ABNORMAL HIGH (ref 3.87–5.11)
RDW: 14.6 % (ref 11.5–15.5)
WBC: 5.1 10*3/uL (ref 4.0–10.5)

## 2023-06-14 LAB — BASIC METABOLIC PANEL
BUN: 17 mg/dL (ref 6–23)
CO2: 34 mEq/L — ABNORMAL HIGH (ref 19–32)
Calcium: 9.9 mg/dL (ref 8.4–10.5)
Chloride: 90 mEq/L — ABNORMAL LOW (ref 96–112)
Creatinine, Ser: 1.33 mg/dL — ABNORMAL HIGH (ref 0.40–1.20)
GFR: 43.39 mL/min — ABNORMAL LOW (ref >60.00)
Glucose, Bld: 105 mg/dL — ABNORMAL HIGH (ref 70–99)
Potassium: 3.7 mEq/L (ref 3.5–5.1)
Sodium: 133 mEq/L — ABNORMAL LOW (ref 135–145)

## 2023-06-14 LAB — HEPATIC FUNCTION PANEL
ALT: 30 U/L (ref 0–35)
AST: 34 U/L (ref 0–37)
Albumin: 4.4 g/dL (ref 3.5–5.2)
Alkaline Phosphatase: 104 U/L (ref 39–117)
Bilirubin, Direct: 0.1 mg/dL (ref 0.0–0.3)
Total Bilirubin: 0.5 mg/dL (ref 0.2–1.2)
Total Protein: 8.3 g/dL (ref 6.0–8.3)

## 2023-06-14 LAB — LIPID PANEL
Cholesterol: 239 mg/dL — ABNORMAL HIGH (ref 0–200)
HDL: 85.6 mg/dL (ref >39.00)
LDL Cholesterol: 138 mg/dL — ABNORMAL HIGH (ref 0–99)
NonHDL: 153.1
Total CHOL/HDL Ratio: 3
Triglycerides: 74 mg/dL (ref 0.0–149.0)
VLDL: 14.8 mg/dL (ref 0.0–40.0)

## 2023-06-14 LAB — TSH: TSH: 3.94 u[IU]/mL (ref 0.35–5.50)

## 2023-06-14 LAB — POCT INFLUENZA A/B
Influenza A, POC: NEGATIVE
Influenza B, POC: NEGATIVE

## 2023-06-14 LAB — POC COVID19 BINAXNOW: SARS Coronavirus 2 Ag: POSITIVE — AB

## 2023-06-14 MED ORDER — NIRMATRELVIR/RITONAVIR (PAXLOVID)TABLET
3.0000 | ORAL_TABLET | Freq: Two times a day (BID) | ORAL | 0 refills | Status: DC
Start: 2023-06-14 — End: 2023-06-14

## 2023-06-14 MED ORDER — PROMETHAZINE-DM 6.25-15 MG/5ML PO SYRP
5.0000 mL | ORAL_SOLUTION | Freq: Four times a day (QID) | ORAL | 0 refills | Status: AC | PRN
Start: 2023-06-14 — End: 2023-06-22

## 2023-06-14 MED ORDER — SHINGRIX 50 MCG/0.5ML IM SUSR
0.5000 mL | Freq: Once | INTRAMUSCULAR | 1 refills | Status: AC
Start: 2023-06-14 — End: 2023-06-14

## 2023-06-14 MED ORDER — NIRMATRELVIR/RITONAVIR (PAXLOVID) TABLET (RENAL DOSING)
2.0000 | ORAL_TABLET | Freq: Two times a day (BID) | ORAL | 0 refills | Status: AC
Start: 2023-06-14 — End: 2023-06-19

## 2023-06-14 NOTE — Progress Notes (Signed)
Subjective:  Patient ID: Michelle Barker, female    DOB: 07/15/1961  Age: 62 y.o. MRN: [id number]  CC: Annual Exam, Hypertension, Hyperlipidemia, and URI   HPI Michelle Barker presents for a CPX and f/up ----   Discussed the use of AI scribe software for clinical note transcription with the patient, who gave verbal consent to proceed.  History of Present Illness   The patient presents with a two-day history of work absence due to a constellation of symptoms that began approximately two days prior. They report a persistent, severe cough that has led to a sensation of ear damage. Accompanying this is a burning sensation localized to the right side of the neck, which has been present since receiving a cortisone shot from a previous provider.  The patient also describes the production of green phlegm and experiences night sweats. Despite the presence of chills, they deny having a fever. There is no reported chest pain or shortness of breath.  In addition to these symptoms, the patient has been experiencing a decreased appetite, leading to a lack of food intake, and has been subsisting primarily on water. They also report an episode of diarrhea, and an instance of diaphoresis accompanied by weakness, which necessitated lying down.  The patient notes pain in the right arm, which they speculate could be related to a previous fall. They deny the presence of swollen lymph nodes.       Outpatient Medications Prior to Visit  Medication Sig Dispense Refill   lidocaine (LIDODERM) 5 % Place 1 patch onto the skin daily. Remove & Discard patch within 12 hours or as directed by MD 30 patch 0   PARoxetine Mesylate 7.5 MG CAPS paroxetine mesylate (menopausal symptoms suppressant) 7.5 mg capsule     potassium chloride (KLOR-CON 10) 10 MEQ tablet Take 1 tablet (10 mEq total) by mouth daily. 180 tablet 0   prednisoLONE acetate (PRED FORTE) 1 % ophthalmic suspension prednisolone acetate 1 % eye drops,suspension      valACYclovir (VALTREX) 500 MG tablet      meloxicam (MOBIC) 15 MG tablet Take 1 tablet (15 mg total) by mouth daily. 30 tablet 0   meloxicam (MOBIC) 7.5 MG tablet Take 1 tablet (7.5 mg total) by mouth daily. 90 tablet 0   triamterene-hydrochlorothiazide (DYAZIDE) 37.5-25 MG capsule Take 1 capsule by mouth once daily 90 capsule 0   No facility-administered medications prior to visit.    ROS Review of Systems  Constitutional:  Positive for chills and fatigue. Negative for fever and unexpected weight change.  HENT:  Positive for congestion and rhinorrhea. Negative for facial swelling, postnasal drip, sinus pressure, sinus pain, sore throat, tinnitus and voice change.   Respiratory:  Positive for cough. Negative for chest tightness, shortness of breath and wheezing.   Cardiovascular:  Negative for chest pain, palpitations and leg swelling.  Gastrointestinal:  Positive for diarrhea. Negative for abdominal pain, blood in stool, constipation, nausea and vomiting.  Genitourinary: Negative.  Negative for difficulty urinating.  Musculoskeletal:  Positive for neck pain. Negative for arthralgias and myalgias.  Skin: Negative.  Negative for color change, pallor and rash.  Neurological: Negative.  Negative for dizziness, speech difficulty, weakness and numbness.  Hematological:  Negative for adenopathy. Does not bruise/bleed easily.  Psychiatric/Behavioral: Negative.      Objective:  BP 120/80 (BP Location: Right Arm, Patient Position: Sitting, Cuff Size: Large)   Pulse 91   Temp 98 F (36.7 C) (Oral)   Resp 16   Ht 5'  3" (1.6 m)   Wt 223 lb (101.2 kg)   SpO2 96%   BMI 39.50 kg/m   BP Readings from Last 3 Encounters:  06/14/23 120/80  03/22/23 132/82  03/13/23 120/78    Wt Readings from Last 3 Encounters:  06/14/23 223 lb (101.2 kg)  05/31/23 234 lb (106.1 kg)  04/19/23 230 lb (104.3 kg)    Physical Exam Vitals reviewed.  Constitutional:      General: She is not in acute  distress.    Appearance: Normal appearance. She is not ill-appearing, toxic-appearing or diaphoretic.  HENT:     Right Ear: Hearing, tympanic membrane, ear canal and external ear normal.     Left Ear: Hearing, tympanic membrane, ear canal and external ear normal.     Nose: Nose normal.     Mouth/Throat:     Mouth: Mucous membranes are moist.     Pharynx: No oropharyngeal exudate or posterior oropharyngeal erythema.  Eyes:     General: No scleral icterus.    Conjunctiva/sclera: Conjunctivae normal.  Neck:     Thyroid: No thyroid mass, thyromegaly or thyroid tenderness.     Trachea: Trachea and phonation normal.  Cardiovascular:     Rate and Rhythm: Normal rate and regular rhythm.     Heart sounds: No murmur heard.    No friction rub. No gallop.  Pulmonary:     Effort: Pulmonary effort is normal.     Breath sounds: No stridor. No wheezing, rhonchi or rales.  Abdominal:     General: Abdomen is flat. There is no distension.     Palpations: There is no mass.     Tenderness: There is no abdominal tenderness. There is no guarding.     Hernia: No hernia is present.  Musculoskeletal:        General: Normal range of motion.     Cervical back: Normal range of motion. No erythema, rigidity or crepitus. Normal range of motion.  Lymphadenopathy:     Head:     Right side of head: No occipital adenopathy.     Left side of head: No occipital adenopathy.     Cervical: No cervical adenopathy.     Upper Body:     Right upper body: No supraclavicular, axillary or pectoral adenopathy.     Left upper body: No supraclavicular, axillary or pectoral adenopathy.  Skin:    General: Skin is warm and dry.     Coloration: Skin is not pale.     Findings: No rash.  Neurological:     General: No focal deficit present.     Mental Status: She is alert. Mental status is at baseline.  Psychiatric:        Mood and Affect: Mood normal.        Behavior: Behavior normal.     Lab Results  Component Value  Date   WBC 5.1 06/14/2023   HGB 15.9 (H) 06/14/2023   HCT 48.9 (H) 06/14/2023   PLT 276.0 06/14/2023   GLUCOSE 105 (H) 06/14/2023   CHOL 239 (H) 06/14/2023   TRIG 74.0 06/14/2023   HDL 85.60 06/14/2023   LDLCALC 138 (H) 06/14/2023   ALT 30 06/14/2023   AST 34 06/14/2023   NA 133 (L) 06/14/2023   K 3.7 06/14/2023   CL 90 (L) 06/14/2023   CREATININE 1.33 (H) 06/14/2023   BUN 17 06/14/2023   CO2 34 (H) 06/14/2023   TSH 3.94 06/14/2023   HGBA1C 5.8 03/06/2016    DG  Ribs Unilateral W/Chest Right  Result Date: 03/08/2023 CLINICAL DATA:  Larey Seat, right rib pain EXAM: RIGHT RIBS AND CHEST - 3+ VIEW COMPARISON:  10/30/2018 FINDINGS: Frontal view of the chest as well as frontal and oblique views of the right thoracic cage are obtained. Cardiac silhouette is enlarged but stable. No airspace disease, effusion, or pneumothorax. There are no acute displaced rib fractures. IMPRESSION: 1. No acute intrathoracic process. 2. No displaced rib fracture. Electronically Signed   By: Sharlet Salina M.D.   On: 03/08/2023 21:05   DG Hip Unilat  With Pelvis 2-3 Views Right  Result Date: 03/08/2023 CLINICAL DATA:  Larey Seat, right hip pain EXAM: DG HIP (WITH OR WITHOUT PELVIS) 2-3V RIGHT COMPARISON:  None Available. FINDINGS: Frontal view of the pelvis as well as frontal and frogleg lateral views of the right hip are obtained. There are no acute displaced fractures. Alignment is anatomic. Mild symmetrical bilateral hip osteoarthritis. Sacroiliac joints are normal. IMPRESSION: 1. Unremarkable pelvis and right hip. Electronically Signed   By: Sharlet Salina M.D.   On: 03/08/2023 21:04   DG Chest 2 View  Result Date: 06/14/2023 CLINICAL DATA:  Shortness of breath and cough EXAM: CHEST - 2 VIEW COMPARISON:  None Available. FINDINGS: The heart size and mediastinal contours are within normal limits. Both lungs are clear. The visualized skeletal structures are unremarkable. IMPRESSION: No active cardiopulmonary disease.  Electronically Signed   By: Allegra Lai M.D.   On: 06/14/2023 16:08     Assessment & Plan:   Essential hypertension - Her blood pressure is overcontrolled.  Will discontinue the diuretic. -     Basic metabolic panel; Future -     CBC with Differential/Platelet; Future -     Hepatic function panel; Future -     TSH; Future  Dyslipidemia, goal LDL below 130- Statin is not indicated. -     Lipoprotein A (LPA); Future -     Lipid panel; Future -     Hepatic function panel; Future -     TSH; Future  Cough productive of purulent sputum -     DG Chest 2 View; Future -     POC COVID-19 BinaxNow -     POCT Influenza A/B -     Promethazine-DM; Take 5 mLs by mouth 4 (four) times daily as needed for up to 8 days for cough.  Dispense: 118 mL; Refill: 0  Acute bronchitis due to COVID-19 virus -     Promethazine-DM; Take 5 mLs by mouth 4 (four) times daily as needed for up to 8 days for cough.  Dispense: 118 mL; Refill: 0 -     nirmatrelvir/ritonavir (renal dosing); Take 2 tablets by mouth 2 (two) times daily for 5 days. (Take nirmatrelvir 150 mg one tablet twice daily for 5 days and ritonavir 100 mg one tablet twice daily for 5 days) Patient GFR is 51  Dispense: 20 tablet; Refill: 0  Encounter for general adult medical examination with abnormal findings-  Exam completed, labs reviewed, vaccines reviewed and updated, cancer screenings addressed, pt ed material was given.   Screening mammogram for breast cancer -     3D Screening Mammogram, Left and Right; Future  Need for prophylactic vaccination and inoculation against varicella -     Shingrix; Inject 0.5 mLs into the muscle once for 1 dose.  Dispense: 0.5 mL; Refill: 1  Stage 3a chronic kidney disease (HCC)- Will avoid nephrotoxic agents.  Routine general medical examination at a health care facility  Follow-up: Return in about 3 months (around 09/14/2023).  Sanda Linger, MD

## 2023-06-14 NOTE — Patient Instructions (Signed)

## 2023-06-15 ENCOUNTER — Other Ambulatory Visit: Payer: Self-pay | Admitting: Internal Medicine

## 2023-06-17 NOTE — Addendum Note (Signed)
Addended by: Etta Grandchild on: 06/17/2023 01:30 PM   Modules accepted: Orders

## 2023-06-20 ENCOUNTER — Ambulatory Visit: Payer: 59 | Admitting: Sports Medicine

## 2023-06-20 NOTE — Progress Notes (Deleted)
    Michelle Barker D.[name] [address] [place] [address] Phone: [phone]   Assessment and Plan:     There are no diagnoses linked to this encounter.  ***   Pertinent previous records reviewed include ***   Follow Up: ***     Subjective:   I,  , am serving as a Neurosurgeon for Doctor Richardean Sale   Chief Complaint: low back and right knee pain    HPI:    03/22/23 Patient is a 62 year old female complaining of low back and right knee pain. Patient states 2 weeks ago that she was in the store shortly prior to arrival and they had just mopped the floors when she slipped and fell landing on her right side. She states that she is having pain all the way down her right side into her right hip.  Right sided body pain ,    Arm - is sore feels like a bruise, decreased ROM, is able to do daily ADLS, intermittent numbness and tingling , ibu and tylenol and that helps a lot    Low back aches around the spine intermittent pain , has a hard time moving around, intermittent radiating pain down the back , not able to sit for long periods of time    Right knee pain when walking up the steps, has to move her leg constantly, she did slide on her knee when she fell, no radiating pain, no numbness or tingling , whole knee pain    04/19/2023 Patient states that she is the same , a little improvement    05/31/2023 Patient states she is in a lot of pain with that shoulder    06/21/2023 Patient states   Relevant Historical Information: Hypertension  Additional pertinent review of systems negative.   Current Outpatient Medications:    lidocaine (LIDODERM) 5 %, Place 1 patch onto the skin daily. Remove & Discard patch within 12 hours or as directed by MD, Disp: 30 patch, Rfl: 0   PARoxetine Mesylate 7.5 MG CAPS, paroxetine mesylate (menopausal symptoms suppressant) 7.5 mg capsule, Disp: , Rfl:    potassium chloride (KLOR-CON 10) 10 MEQ  tablet, Take 1 tablet (10 mEq total) by mouth daily., Disp: 180 tablet, Rfl: 0   prednisoLONE acetate (PRED FORTE) 1 % ophthalmic suspension, prednisolone acetate 1 % eye drops,suspension, Disp: , Rfl:    promethazine-dextromethorphan (PROMETHAZINE-DM) 6.25-15 MG/5ML syrup, Take 5 mLs by mouth 4 (four) times daily as needed for up to 8 days for cough., Disp: 118 mL, Rfl: 0   valACYclovir (VALTREX) 500 MG tablet, , Disp: , Rfl:    Objective:     There were no vitals filed for this visit.    There is no height or weight on file to calculate BMI.    Physical Exam:    ***   Electronically signed by:  Michelle Barker [name] 4:26 PM 06/20/23

## 2023-06-21 ENCOUNTER — Ambulatory Visit: Payer: 59 | Admitting: Sports Medicine

## 2023-06-22 ENCOUNTER — Inpatient Hospital Stay: Admission: RE | Admit: 2023-06-22 | Payer: No Typology Code available for payment source | Source: Ambulatory Visit

## 2023-06-22 ENCOUNTER — Ambulatory Visit
Admission: RE | Admit: 2023-06-22 | Discharge: 2023-06-22 | Disposition: A | Discharge status: Home / Self Care | Payer: No Typology Code available for payment source | Source: Ambulatory Visit | Attending: Internal Medicine | Admitting: Internal Medicine

## 2023-06-22 DIAGNOSIS — E7841 Elevated Lipoprotein(a): Secondary | ICD-10-CM

## 2023-06-27 NOTE — Progress Notes (Deleted)
    Michelle Barker D.[name] [address] [place] [address] Phone: [phone]   Assessment and Plan:     There are no diagnoses linked to this encounter.  ***   Pertinent previous records reviewed include ***   Follow Up: ***     Subjective:   I, Michelle Barker, am serving as a Neurosurgeon for Doctor Richardean Sale   Chief Complaint: low back and right knee pain    HPI:    03/22/23 Patient is a 62 year old female complaining of low back and right knee pain. Patient states 2 weeks ago that she was in the store shortly prior to arrival and they had just mopped the floors when she slipped and fell landing on her right side. She states that she is having pain all the way down her right side into her right hip.  Right sided body pain ,    Arm - is sore feels like a bruise, decreased ROM, is able to do daily ADLS, intermittent numbness and tingling , ibu and tylenol and that helps a lot    Low back aches around the spine intermittent pain , has a hard time moving around, intermittent radiating pain down the back , not able to sit for long periods of time    Right knee pain when walking up the steps, has to move her leg constantly, she did slide on her knee when she fell, no radiating pain, no numbness or tingling , whole knee pain    04/19/2023 Patient states that she is the same , a little improvement    05/31/2023 Patient states she is in a lot of pain with that shoulder    06/28/2023 Patient states   Relevant Historical Information: Hypertension Additional pertinent review of systems negative.   Current Outpatient Medications:    lidocaine (LIDODERM) 5 %, Place 1 patch onto the skin daily. Remove & Discard patch within 12 hours or as directed by MD, Disp: 30 patch, Rfl: 0   PARoxetine Mesylate 7.5 MG CAPS, paroxetine mesylate (menopausal symptoms suppressant) 7.5 mg capsule, Disp: , Rfl:    potassium chloride (KLOR-CON 10) 10 MEQ  tablet, Take 1 tablet (10 mEq total) by mouth daily., Disp: 180 tablet, Rfl: 0   prednisoLONE acetate (PRED FORTE) 1 % ophthalmic suspension, prednisolone acetate 1 % eye drops,suspension, Disp: , Rfl:    valACYclovir (VALTREX) 500 MG tablet, , Disp: , Rfl:    Objective:     There were no vitals filed for this visit.    There is no height or weight on file to calculate BMI.    Physical Exam:    ***   Electronically signed by:  Michelle Barker [name] 7:31 AM 06/27/23

## 2023-06-28 ENCOUNTER — Ambulatory Visit: Payer: 59 | Admitting: Sports Medicine

## 2023-06-28 ENCOUNTER — Emergency Department (HOSPITAL_BASED_OUTPATIENT_CLINIC_OR_DEPARTMENT_OTHER)
Admission: EM | Admit: 2023-06-28 | Discharge: 2023-06-28 | Disposition: A | Discharge status: Home / Self Care | Payer: 59 | Attending: Emergency Medicine | Admitting: Emergency Medicine

## 2023-06-28 ENCOUNTER — Encounter (HOSPITAL_BASED_OUTPATIENT_CLINIC_OR_DEPARTMENT_OTHER): Payer: Self-pay | Admitting: Emergency Medicine

## 2023-06-28 ENCOUNTER — Emergency Department (HOSPITAL_BASED_OUTPATIENT_CLINIC_OR_DEPARTMENT_OTHER): Payer: 59

## 2023-06-28 ENCOUNTER — Other Ambulatory Visit: Payer: Self-pay

## 2023-06-28 DIAGNOSIS — M5412 Radiculopathy, cervical region: Secondary | ICD-10-CM | POA: Diagnosis not present

## 2023-06-28 DIAGNOSIS — M542 Cervicalgia: Secondary | ICD-10-CM | POA: Diagnosis present

## 2023-06-28 DIAGNOSIS — I10 Essential (primary) hypertension: Secondary | ICD-10-CM | POA: Insufficient documentation

## 2023-06-28 LAB — CBC WITH DIFFERENTIAL/PLATELET
Abs Immature Granulocytes: 0.01 10*3/uL (ref 0.00–0.07)
Basophils Absolute: 0 10*3/uL (ref 0.0–0.1)
Basophils Relative: 0 %
Eosinophils Absolute: 0 10*3/uL (ref 0.0–0.5)
Eosinophils Relative: 1 %
HCT: 39.3 % (ref 36.0–46.0)
Hemoglobin: 13.4 g/dL (ref 12.0–15.0)
Immature Granulocytes: 0 %
Lymphocytes Relative: 35 %
Lymphs Abs: 1.7 10*3/uL (ref 0.7–4.0)
MCH: 30.2 pg (ref 26.0–34.0)
MCHC: 34.1 g/dL (ref 30.0–36.0)
MCV: 88.7 fL (ref 80.0–100.0)
Monocytes Absolute: 0.4 10*3/uL (ref 0.1–1.0)
Monocytes Relative: 8 %
Neutro Abs: 2.7 10*3/uL (ref 1.7–7.7)
Neutrophils Relative %: 56 %
Platelets: 275 10*3/uL (ref 150–400)
RBC: 4.43 MIL/uL (ref 3.87–5.11)
RDW: 14.5 % (ref 11.5–15.5)
WBC: 4.8 10*3/uL (ref 4.0–10.5)
nRBC: 0 % (ref 0.0–0.2)

## 2023-06-28 LAB — BASIC METABOLIC PANEL
Anion gap: 7 (ref 5–15)
BUN: 13 mg/dL (ref 6–20)
CO2: 28 mmol/L (ref 22–32)
Calcium: 9.6 mg/dL (ref 8.9–10.3)
Chloride: 106 mmol/L (ref 98–111)
Creatinine, Ser: 0.99 mg/dL (ref 0.44–1.00)
GFR, Estimated: >60 mL/min (ref >60)
Glucose, Bld: 90 mg/dL (ref 70–99)
Potassium: 3.9 mmol/L (ref 3.5–5.1)
Sodium: 141 mmol/L (ref 135–145)

## 2023-06-28 MED ORDER — METHYLPREDNISOLONE 4 MG PO TBPK
ORAL_TABLET | ORAL | 0 refills | Status: DC
Start: 2023-06-28 — End: 2024-03-27

## 2023-06-28 MED ORDER — PREDNISONE 50 MG PO TABS
60.0000 mg | ORAL_TABLET | Freq: Once | ORAL | Status: AC
  Administered 2023-06-28: 60 mg via ORAL
  Filled 2023-06-28: qty 1

## 2023-06-28 MED ORDER — KETOROLAC TROMETHAMINE 30 MG/ML IJ SOLN
15.0000 mg | Freq: Once | INTRAMUSCULAR | Status: AC
  Administered 2023-06-28: 15 mg via INTRAVENOUS
  Filled 2023-06-28: qty 1

## 2023-06-28 NOTE — ED Triage Notes (Signed)
Pt caox4, ambulatory c/o burning sensation around anterior R shoulder especially when touching the skin. Pt reports she had her first cortisone shot in R shoulder approx 2 weeks ago and the sensation started shortly after but has gotten worse over time. Pt denies hives, swelling, redness, or itching.

## 2023-06-28 NOTE — ED Notes (Signed)
Pt received AVS; D/c, prescriptions, and follow up care discussed; pt verbalized understanding and had no further questions upon d/c.

## 2023-06-28 NOTE — ED Provider Notes (Signed)
[place] EMERGENCY DEPARTMENT AT [facility] Provider Note   CSN: [id number] Arrival date & time: 06/28/23  1620     History  Chief Complaint  Patient presents with   Medication Reaction    Michelle Barker is a 62 y.o. female.  Michelle Barker is a 62 y.o. female history of hypertension and right shoulder issues for which she has been following with Dr. Jean Rosenthal with sports medicine, who presents to the emergency department complaining of a burning sensation over the right anterior neck and shoulder just above the her collarbone.  She reports that these symptoms started 2 or 3 weeks after she received a cortisone shot in her right shoulder.  This feels different than the pain she had been having over her shoulder which was primarily across the anterior shoulder and deltoid, had a shoulder MRI that showed rotator cuff tendinopathy.  She reports that the area just above her right collarbone feels hypersensitive to touch and burns.  She is unsure if anything makes it better or worse but reports that other medication that she has been taking such as meloxicam has not really helped.  She has not noted pain in the posterior neck.  No new injury or fall.  No numbness tingling or weakness extending into the arm.  No associated chest pain or shortness of breath.  No overlying skin changes.  No other aggravating or alleviating factors.  The history is provided by the patient and medical records.       Home Medications Prior to Admission medications   Medication Sig Start Date End Date Taking? Authorizing Provider  lidocaine (LIDODERM) 5 % Place 1 patch onto the skin daily. Remove & Discard patch within 12 hours or as directed by MD 03/08/23   Elayne Snare K, DO  PARoxetine Mesylate 7.5 MG CAPS paroxetine mesylate (menopausal symptoms suppressant) 7.5 mg capsule    [provider]  potassium chloride (KLOR-CON 10) 10 MEQ tablet Take 1 tablet (10 mEq total) by mouth daily.  11/21/21   Etta Grandchild, MD  prednisoLONE acetate (PRED FORTE) 1 % ophthalmic suspension prednisolone acetate 1 % eye drops,suspension    [provider]  valACYclovir (VALTREX) 500 MG tablet     [provider]      Allergies    Patient has no known allergies.    Review of Systems   Review of Systems  Constitutional:  Negative for chills and fever.  Musculoskeletal:  Positive for arthralgias and neck pain.    Physical Exam Updated Vital Signs BP (!) 151/96 (BP Location: Right Arm)   Pulse 76   Temp 98.4 F (36.9 C) (Oral)   Resp 18   Ht 5\' 3"  (1.6 m)   Wt 102.5 kg   SpO2 98%   BMI 40.03 kg/m  Physical Exam Vitals and nursing note reviewed.  Constitutional:      General: She is not in acute distress.    Appearance: Normal appearance. She is well-developed. She is not ill-appearing or diaphoretic.  HENT:     Head: Normocephalic and atraumatic.  Eyes:     General:        Right eye: No discharge.        Left eye: No discharge.  Pulmonary:     Effort: Pulmonary effort is normal. No respiratory distress.  Musculoskeletal:       Arms:     Comments: Hypersensitive to touch just above the right collarbone in the distribution of the right C4 dermatome, no  overlying skin changes, no bony tenderness over the clavicle.  Some pain with shoulder movement but patient has full active and passive range of motion.  Radial pulses 2+, 5/5 strength and normal sensation in the hand.  No bony tenderness over the cervical spine or palpable step-off or deformity.  Pain is worsened when patient turns her neck as far she can to the right, but Spurling maneuver negative  Neurological:     Mental Status: She is alert and oriented to person, place, and time.     Coordination: Coordination normal.  Psychiatric:        Mood and Affect: Mood normal.        Behavior: Behavior normal.     ED Results / Procedures / Treatments   Labs (all labs ordered are listed, but only abnormal  results are displayed) Labs Reviewed - No data to display  EKG None  Radiology No results found.  Procedures Procedures    Medications Ordered in ED Medications - No data to display  ED Course/ Medical Decision Making/ A&P                                 Medical Decision Making Amount and/or Complexity of Data Reviewed Labs: ordered. Radiology: ordered.  Risk Prescription drug management.   62 year old female presents with 2 weeks of worsening pain just above the right collarbone, described as burning and hypersensitive to touch, no overlying skin changes to suggest shingles or other rash, no bony tenderness and x-rays of the clavicle and right shoulder obtained, and independently reviewed and interpreted without bony abnormality.  Given recent joint injection considered septic arthritis but patient has full range of motion of the shoulder and pain seems to be isolated to the C4 dermatome concern for cervical radiculopathy.  Will treat with Medrol Dosepak and have patient follow back up with sports medicine who she has been following with for right shoulder issues.        Final Clinical Impression(s) / ED Diagnoses Final diagnoses:  Cervical radiculopathy    Rx / DC Orders ED Discharge Orders          Ordered    methylPREDNISolone (MEDROL DOSEPAK) 4 MG TBPK tablet        06/28/23 1923              Dartha Lodge, [place] 07/05/23 2130    Linwood Dibbles, MD 07/24/23 [phone]

## 2023-06-28 NOTE — Discharge Instructions (Addendum)
I suspect you may have inflammation or irritation of your C4 nerve on the right side and this can cause burning and hypersensitivity to the area above your collarbone. Take steroid dose pack as prescribed, you can use lidocaine patches over the area. Follow up with your PCP and sports medicine doctor regarding.

## 2023-06-29 ENCOUNTER — Telehealth: Payer: Self-pay

## 2023-06-29 NOTE — Transitions of Care (Post Inpatient/ED Visit) (Unsigned)
   06/29/2023  Name: Michelle Barker MRN: [id number] DOB: 05/31/1961  Today's TOC FU Call Status: Today's TOC FU Call Status:: Unsuccessful Call (1st Attempt) Unsuccessful Call (1st Attempt) Date: 06/29/23  Attempted to reach the patient regarding the most recent Inpatient/ED visit.  Follow Up Plan: Additional outreach attempts will be made to reach the patient to complete the Transitions of Care (Post Inpatient/ED visit) call.   Signature  Karena Addison, LPN [facility] Nurse Health Advisor Direct Dial [phone]

## 2023-07-03 NOTE — Transitions of Care (Post Inpatient/ED Visit) (Unsigned)
   07/03/2023  Name: Michelle Barker MRN: [id number] DOB: Aug 22, 1961  Today's TOC FU Call Status: Today's TOC FU Call Status:: Unsuccessful Call (2nd Attempt) Unsuccessful Call (1st Attempt) Date: 06/29/23 Unsuccessful Call (2nd Attempt) Date: 07/03/23  Attempted to reach the patient regarding the most recent Inpatient/ED visit.  Follow Up Plan: Additional outreach attempts will be made to reach the patient to complete the Transitions of Care (Post Inpatient/ED visit) call.   Signature Karena Addison, LPN [facility] Nurse Health Advisor Direct Dial [phone]

## 2023-07-04 ENCOUNTER — Ambulatory Visit: Payer: 59 | Admitting: Internal Medicine

## 2023-07-04 ENCOUNTER — Ambulatory Visit: Payer: 59 | Admitting: Sports Medicine

## 2023-07-04 ENCOUNTER — Ambulatory Visit (INDEPENDENT_AMBULATORY_CARE_PROVIDER_SITE_OTHER): Payer: 59

## 2023-07-04 VITALS — BP 136/84 | HR 72 | Ht 63.0 in

## 2023-07-04 DIAGNOSIS — M542 Cervicalgia: Secondary | ICD-10-CM | POA: Diagnosis not present

## 2023-07-04 DIAGNOSIS — G8929 Other chronic pain: Secondary | ICD-10-CM

## 2023-07-04 DIAGNOSIS — M19011 Primary osteoarthritis, right shoulder: Secondary | ICD-10-CM

## 2023-07-04 DIAGNOSIS — M25511 Pain in right shoulder: Secondary | ICD-10-CM

## 2023-07-04 MED ORDER — METHYLPREDNISOLONE ACETATE 80 MG/ML IJ SUSP
80.0000 mg | Freq: Once | INTRAMUSCULAR | Status: AC
Start: 2023-07-04 — End: 2023-07-04
  Administered 2023-07-04: 80 mg via INTRAMUSCULAR

## 2023-07-04 MED ORDER — GABAPENTIN 100 MG PO CAPS: ORAL_CAPSULE | ORAL | 0 refills | Status: AC

## 2023-07-04 MED ORDER — KETOROLAC TROMETHAMINE 60 MG/2ML IM SOLN
60.0000 mg | Freq: Once | INTRAMUSCULAR | Status: AC
Start: 2023-07-04 — End: 2023-07-04
  Administered 2023-07-04: 60 mg via INTRAMUSCULAR

## 2023-07-04 NOTE — Patient Instructions (Addendum)
Good to see you  Injection in backside today Start gabapentin 100-300 mg nightly  Recommend starting at 100mg  and slowly increasing MRI of neck ordered they will call you to schedule  Follow up 4 days after MRI

## 2023-07-04 NOTE — Transitions of Care (Post Inpatient/ED Visit) (Signed)
   07/04/2023  Name: Michelle Barker MRN: [id number] DOB: [birth year]  Today's TOC FU Call Status: Today's TOC FU Call Status:: Unsuccessful Call (2nd Attempt) Unsuccessful Call (1st Attempt) Date: 06/29/23 Unsuccessful Call (2nd Attempt) Date: 07/03/23  Attempted to reach the patient regarding the most recent Inpatient/ED visit.  Follow Up Plan: No further outreach attempts will be made at this time. We have been unable to contact the patient. Already seen in office Signature Karena Addison, LPN [facility] Nurse Health Advisor Direct Dial [phone]

## 2023-07-04 NOTE — Progress Notes (Signed)
[facility] [address] [place] [address] Phone: [phone]   Assessment and Plan:     1. Cervicalgia 2. Neck pain 3. Chronic right shoulder pain 4. Primary osteoarthritis of right shoulder -Chronic with exacerbation, worsening, subsequent visit - Patient continues to have right-sided shoulder pain, however is now experiencing significant tenderness and pain throughout neck, right shoulder, into right arm down to elbow.  Patient received mild benefit from subacromial CSI on 05/31/2023, however pain and continued and worsened for the past 2 to 3 weeks - Patient does have biceps tendinitis, moderate osteoarthritis of right shoulder, rotator cuff tendinopathy that could contribute to pain, however patient's physical exam is more consistent with cervical pathology based on today's exam.  Patient does have degenerative changes seen on x-ray at today's visit.  Based on failure to improve with >6 weeks of conservative therapy, worsening pain, pain frequently >6/10, pain affecting day-to-day activities, I recommend further evaluation with C-spine MRI.  Order placed today - Patient may start gabapentin 100 mg and titrate up to gabapentin 300 mg maximum nightly as needed for nerve pain.  New prescription provided - Continue Tylenol and ibuprofen for day-to-day pain relief - Patient elected for IM injection of methylprednisone 80 mg/Toradol 60 mg.  Injection given in clinic today and tolerated well. - X-ray obtained in clinic.  My interpretation: No acute fracture or dislocation.  Broad degenerative changes present in cervical spine primarily from C4-C7 with anterolisthesis C7 on T1  Pertinent previous records reviewed include right shoulder x-ray 06/28/2023, right clavicle x-ray 06/28/2023, urgent care note 06/28/2023   Follow Up: 4 days after MRI to review results and discuss treatment plan   Subjective:    Chief Complaint: neck and right  shoulder pain   HPI:   03/22/23 Patient is a 62 year old female complaining of low back and right knee pain. Patient states 2 weeks ago that she was in the store shortly prior to arrival and they had just mopped the floors when she slipped and fell landing on her right side. She states that she is having pain all the way down her right side into her right hip.  Right sided body pain ,    Arm - is sore feels like a bruise, decreased ROM, is able to do daily ADLS, intermittent numbness and tingling , ibu and tylenol and that helps a lot    Low back aches around the spine intermittent pain , has a hard time moving around, intermittent radiating pain down the back , not able to sit for long periods of time    Right knee pain when walking up the steps, has to move her leg constantly, she did slide on her knee when she fell, no radiating pain, no numbness or tingling , whole knee pain    04/19/2023 Patient states that she is the same , a little improvement    05/31/2023 Patient states she is in a lot of pain with that shoulder  07/04/23 Patient was seen at urgent care for neck pain and had clavicle and shoulder x rays done. Patient was given a prednisone dose pak for the pain. Today patient states that she will get pain in her neck and when it starts to do its throbbing pain it will radiate up to her ear down her shoulder and her arm. Patient was told she could use the lidocaine patch she feels mentally it helps but not sure if it really is  helping. The pain is so much worse with any type of touch and it makes her jump. Patient is unable to turn her head to the right.   Relevant Historical Information:  Hypertension  Additional pertinent review of systems negative.   Current Outpatient Medications:    gabapentin (NEURONTIN) 100 MG capsule, 100-300mg  nightly, Disp: 40 capsule, Rfl: 0   lidocaine (LIDODERM) 5 %, Place 1 patch onto the skin daily. Remove & Discard patch within 12 hours or as directed  by MD, Disp: 30 patch, Rfl: 0   methylPREDNISolone (MEDROL DOSEPAK) 4 MG TBPK tablet, Take as directed, Disp: 21 tablet, Rfl: 0   PARoxetine Mesylate 7.5 MG CAPS, paroxetine mesylate (menopausal symptoms suppressant) 7.5 mg capsule, Disp: , Rfl:    potassium chloride (KLOR-CON 10) 10 MEQ tablet, Take 1 tablet (10 mEq total) by mouth daily., Disp: 180 tablet, Rfl: 0   prednisoLONE acetate (PRED FORTE) 1 % ophthalmic suspension, prednisolone acetate 1 % eye drops,suspension, Disp: , Rfl:    valACYclovir (VALTREX) 500 MG tablet, , Disp: , Rfl:    Objective:     Vitals:   07/04/23 1408  BP: 136/84  Pulse: 72  SpO2: 99%  Height: 5\' 3"  (1.6 m)      Body mass index is 40.03 kg/m.    Physical Exam:    Gen: Appears well, nad, nontoxic and pleasant Neuro:sensation intact, strength is 5/5 with df/pf/inv/ev, muscle tone wnl Skin: no suspicious lesion or defmority Psych: A&O, appropriate mood and affect   Neck: Reduced sidebending to right and right rotation due to pain.  Tenderness Wishart pain to light palpation of right sided neck, right clavicle, right shoulder Positive Spurling test on right   Right shoulder:  No deformity, swelling or muscle wasting No scapular winging FF 90, abd 90, int 20, ext 80 Special testing limited due to painful ROM    Electronically signed by:  Michelle [facility] 3:00 PM 07/04/23

## 2023-07-14 ENCOUNTER — Ambulatory Visit
Admission: RE | Admit: 2023-07-14 | Discharge: 2023-07-14 | Disposition: A | Discharge status: Home / Self Care | Payer: 59 | Source: Ambulatory Visit | Attending: Sports Medicine | Admitting: Sports Medicine

## 2023-07-14 DIAGNOSIS — M542 Cervicalgia: Secondary | ICD-10-CM

## 2023-07-20 ENCOUNTER — Inpatient Hospital Stay: Admission: RE | Admit: 2023-07-20 | Payer: 59 | Source: Ambulatory Visit

## 2023-07-29 ENCOUNTER — Other Ambulatory Visit: Payer: Self-pay | Admitting: Internal Medicine

## 2023-07-29 DIAGNOSIS — I10 Essential (primary) hypertension: Secondary | ICD-10-CM

## 2023-07-29 DIAGNOSIS — E876 Hypokalemia: Secondary | ICD-10-CM

## 2023-08-02 ENCOUNTER — Inpatient Hospital Stay: Admission: RE | Admit: 2023-08-02 | Payer: 59 | Source: Ambulatory Visit

## 2023-09-17 ENCOUNTER — Other Ambulatory Visit: Payer: Self-pay | Admitting: Sports Medicine

## 2023-10-03 ENCOUNTER — Other Ambulatory Visit: Payer: Self-pay | Admitting: Sports Medicine

## 2024-01-14 ENCOUNTER — Encounter: Payer: Self-pay | Admitting: Gastroenterology

## 2024-03-27 ENCOUNTER — Encounter: Payer: Self-pay | Admitting: Internal Medicine

## 2024-03-27 ENCOUNTER — Other Ambulatory Visit: Payer: Self-pay | Admitting: Internal Medicine

## 2024-03-27 NOTE — Telephone Encounter (Signed)
 She needs to be seen Kidney function has to be checked prior to prescribing paxlovid 

## 2024-03-28 ENCOUNTER — Telehealth: Payer: Self-pay | Admitting: Nurse Practitioner

## 2024-03-28 ENCOUNTER — Encounter: Payer: Self-pay | Admitting: Nurse Practitioner

## 2024-03-28 DIAGNOSIS — U071 COVID-19: Secondary | ICD-10-CM | POA: Diagnosis not present

## 2024-03-28 MED ORDER — PROMETHAZINE-DM 6.25-15 MG/5ML PO SYRP
5.0000 mL | ORAL_SOLUTION | Freq: Four times a day (QID) | ORAL | 0 refills | Status: AC | PRN
Start: 2024-03-28 — End: ?

## 2024-03-28 MED ORDER — ALBUTEROL SULFATE HFA 108 (90 BASE) MCG/ACT IN AERS
2.0000 | INHALATION_SPRAY | Freq: Four times a day (QID) | RESPIRATORY_TRACT | 0 refills | Status: AC | PRN
Start: 2024-03-28 — End: ?

## 2024-03-28 NOTE — Assessment & Plan Note (Signed)
 Acute Overall resolving, patient does continue to have cough. We did discuss that unfortunately she is outside of the treatment window with Paxlovid  but we can manage her symptoms. Will send prescription of promethazine  dextromethorphan cough syrup she can take 5 mL by mouth every 4 hours as needed.  Also send in albuterol inhaler she can take 2 puffs into the lungs every 6 hours as needed. She was told to return to office if symptoms worsen or if they persist past an additional 4 weeks for in person evaluation.  She reports her understanding.

## 2024-03-28 NOTE — Progress Notes (Signed)
   Established Patient Office Visit  An audio/visual tele-health visit was completed today for this patient. I connected with  Michelle Barker on 03/28/24 utilizing audio/visual technology and verified that I am speaking with the correct person using two identifiers. The patient was located at their home, and I was located at the office of [facility] at [facility] during the encounter. I discussed the limitations of evaluation and management by telemedicine. The patient expressed understanding and agreed to proceed.     Subjective   Patient ID: Michelle Barker, female    DOB: March 02, 1961  Age: 63 y.o. MRN: [id number]  Chief Complaint  Patient presents with   Covid Positive    Patient has a visit for the above.  Symptom onset 8 days ago.  She did take an at home test and was COVID-positive.  Overall she is feeling better but she continues to have this dry cough and chest pain.  It is affecting her ability to sleep at night.  She has been taking Robitussin without symptom relief.  She did have COVID infection back in August 2024 and was treated with Paxil bid as well as promethazine  dextromethorphan cough syrup with improvement in symptoms.  She also reports some diaphoresis at night, she is not sure if she is running a fever.    Review of Systems  Constitutional:  Positive for diaphoresis.  Respiratory:  Positive for cough and wheezing.   Cardiovascular:  Positive for chest pain.      Objective:     There were no vitals taken for this visit.   Physical Exam Comprehensive physical exam not completed today as office visit was conducted remotely.  Patient appears stable over video, no evidence of acute respiratory distress.  She does cough a few times during the visit..  Patient was alert and oriented, and appeared to have appropriate judgment.   No results found for any visits on 03/28/24.    The 10-year ASCVD risk score (Arnett DK, et al., 2019) is: 6.3%     Assessment & Plan:   Problem List Items Addressed This Visit   None Visit Diagnoses       COVID-19    -  Primary   Relevant Medications   promethazine -dextromethorphan (PROMETHAZINE -DM) 6.25-15 MG/5ML syrup   albuterol (VENTOLIN HFA) 108 (90 Base) MCG/ACT inhaler       Return if symptoms worsen or fail to improve.    Michelle Hiss, NP

## 2024-03-31 NOTE — Telephone Encounter (Signed)
 Issues was fixed during pt visit on 03/28/24

## 2024-04-19 ENCOUNTER — Emergency Department (HOSPITAL_BASED_OUTPATIENT_CLINIC_OR_DEPARTMENT_OTHER)

## 2024-04-19 ENCOUNTER — Other Ambulatory Visit (HOSPITAL_BASED_OUTPATIENT_CLINIC_OR_DEPARTMENT_OTHER): Payer: Self-pay

## 2024-04-19 ENCOUNTER — Other Ambulatory Visit: Payer: Self-pay

## 2024-04-19 ENCOUNTER — Emergency Department (HOSPITAL_BASED_OUTPATIENT_CLINIC_OR_DEPARTMENT_OTHER)
Admission: EM | Admit: 2024-04-19 | Discharge: 2024-04-19 | Disposition: A | Discharge status: Home / Self Care | Attending: Emergency Medicine | Admitting: Emergency Medicine

## 2024-04-19 ENCOUNTER — Encounter (HOSPITAL_BASED_OUTPATIENT_CLINIC_OR_DEPARTMENT_OTHER): Payer: Self-pay

## 2024-04-19 DIAGNOSIS — N189 Chronic kidney disease, unspecified: Secondary | ICD-10-CM | POA: Insufficient documentation

## 2024-04-19 DIAGNOSIS — R918 Other nonspecific abnormal finding of lung field: Secondary | ICD-10-CM | POA: Diagnosis not present

## 2024-04-19 DIAGNOSIS — R600 Localized edema: Secondary | ICD-10-CM | POA: Diagnosis not present

## 2024-04-19 DIAGNOSIS — R079 Chest pain, unspecified: Secondary | ICD-10-CM | POA: Diagnosis not present

## 2024-04-19 DIAGNOSIS — I517 Cardiomegaly: Secondary | ICD-10-CM | POA: Diagnosis not present

## 2024-04-19 DIAGNOSIS — R0602 Shortness of breath: Secondary | ICD-10-CM | POA: Diagnosis not present

## 2024-04-19 DIAGNOSIS — I129 Hypertensive chronic kidney disease with stage 1 through stage 4 chronic kidney disease, or unspecified chronic kidney disease: Secondary | ICD-10-CM | POA: Diagnosis not present

## 2024-04-19 DIAGNOSIS — Z79899 Other long term (current) drug therapy: Secondary | ICD-10-CM | POA: Diagnosis not present

## 2024-04-19 DIAGNOSIS — I700 Atherosclerosis of aorta: Secondary | ICD-10-CM | POA: Diagnosis not present

## 2024-04-19 LAB — CBC WITH DIFFERENTIAL/PLATELET
Abs Immature Granulocytes: 0 10*3/uL (ref 0.00–0.07)
Basophils Absolute: 0 10*3/uL (ref 0.0–0.1)
Basophils Relative: 0 %
Eosinophils Absolute: 0.1 10*3/uL (ref 0.0–0.5)
Eosinophils Relative: 2 %
HCT: 35.8 % — ABNORMAL LOW (ref 36.0–46.0)
Hemoglobin: 12.3 g/dL (ref 12.0–15.0)
Immature Granulocytes: 0 %
Lymphocytes Relative: 45 %
Lymphs Abs: 1.3 10*3/uL (ref 0.7–4.0)
MCH: 30 pg (ref 26.0–34.0)
MCHC: 34.4 g/dL (ref 30.0–36.0)
MCV: 87.3 fL (ref 80.0–100.0)
Monocytes Absolute: 0.3 10*3/uL (ref 0.1–1.0)
Monocytes Relative: 10 %
Neutro Abs: 1.2 10*3/uL — ABNORMAL LOW (ref 1.7–7.7)
Neutrophils Relative %: 43 %
Platelets: 233 10*3/uL (ref 150–400)
RBC: 4.1 MIL/uL (ref 3.87–5.11)
RDW: 14.4 % (ref 11.5–15.5)
WBC: 2.9 10*3/uL — ABNORMAL LOW (ref 4.0–10.5)
nRBC: 0 % (ref 0.0–0.2)

## 2024-04-19 LAB — COMPREHENSIVE METABOLIC PANEL WITH GFR
ALT: 10 U/L (ref 0–44)
AST: 19 U/L (ref 15–41)
Albumin: 3.8 g/dL (ref 3.5–5.0)
Alkaline Phosphatase: 108 U/L (ref 38–126)
Anion gap: 11 (ref 5–15)
BUN: 12 mg/dL (ref 8–23)
CO2: 26 mmol/L (ref 22–32)
Calcium: 9.3 mg/dL (ref 8.9–10.3)
Chloride: 106 mmol/L (ref 98–111)
Creatinine, Ser: 1.2 mg/dL — ABNORMAL HIGH (ref 0.44–1.00)
GFR, Estimated: 51 mL/min — ABNORMAL LOW (ref >60)
Glucose, Bld: 84 mg/dL (ref 70–99)
Potassium: 3.2 mmol/L — ABNORMAL LOW (ref 3.5–5.1)
Sodium: 143 mmol/L (ref 135–145)
Total Bilirubin: 0.4 mg/dL (ref 0.0–1.2)
Total Protein: 6.2 g/dL — ABNORMAL LOW (ref 6.5–8.1)

## 2024-04-19 LAB — PRO BRAIN NATRIURETIC PEPTIDE: Pro Brain Natriuretic Peptide: 111 pg/mL (ref <300.0)

## 2024-04-19 LAB — TROPONIN T, HIGH SENSITIVITY: Troponin T High Sensitivity: <15 ng/L (ref <19)

## 2024-04-19 LAB — D-DIMER, QUANTITATIVE: D-Dimer, Quant: 0.8 ug{FEU}/mL — ABNORMAL HIGH (ref 0.00–0.50)

## 2024-04-19 MED ORDER — IOHEXOL 350 MG/ML SOLN
75.0000 mL | Freq: Once | INTRAVENOUS | Status: AC | PRN
  Administered 2024-04-19: 75 mL via INTRAVENOUS

## 2024-04-19 MED ORDER — AZITHROMYCIN 250 MG PO TABS
ORAL_TABLET | ORAL | 0 refills | Status: AC
  Filled 2024-04-19: qty 6, 5d supply, fill #0

## 2024-04-19 NOTE — Discharge Instructions (Signed)
 Follow-up with your primary care doctor.  Start antibiotic to help with inflammation in the lungs.  Return if symptoms worsen.

## 2024-04-19 NOTE — ED Provider Notes (Signed)
 [place] EMERGENCY DEPARTMENT AT [facility] Provider Note   CSN: [id number] Arrival date & time: 04/19/24  1039     History  Chief Complaint  Patient presents with   Shortness of Breath    Michelle Barker is a 63 y.o. female.  Patient here with shortness of breath for the last week or so.  Maybe some leg swelling.  She denies any weakness numbness tingling.  No cough or sputum production.  Maybe little bit of chest discomfort on the right side of the chest.  She is to be on a diuretic but no longer on it.  Denies any recent surgery or travel.  Denies any pain in the right arm at this time.  She will have some discomfort there intermittently.  Some chronic neck issues she states.  History of hypertension otherwise.  The history is provided by the patient.       Home Medications Prior to Admission medications   Medication Sig Start Date End Date Taking? Authorizing Provider  azithromycin (ZITHROMAX) 250 MG tablet Take 1 tablet (250 mg total) by mouth daily. Take first 2 tablets together, then 1 every day until finished. 04/19/24  Yes Nielle Duford, DO  albuterol  (VENTOLIN  HFA) 108 (90 Base) MCG/ACT inhaler Inhale 2 puffs into the lungs every 6 (six) hours as needed for wheezing or shortness of breath. 03/28/24   Zorita Hiss, NP  gabapentin  (NEURONTIN ) 100 MG capsule 100-300mg  nightly 07/04/23   Ulysees Gander, DO  lidocaine  (LIDODERM ) 5 % Place 1 patch onto the skin daily. Remove & Discard patch within 12 hours or as directed by MD 03/08/23   Kingsley, Victoria K, DO  PARoxetine Mesylate 7.5 MG CAPS paroxetine mesylate (menopausal symptoms suppressant) 7.5 mg capsule    [provider]  potassium chloride  (KLOR-CON  10) 10 MEQ tablet Take 1 tablet (10 mEq total) by mouth daily. 11/21/21   Arcadio Knuckles, MD  prednisoLONE acetate (PRED FORTE) 1 % ophthalmic suspension prednisolone acetate 1 % eye drops,suspension Patient not taking: Reported on 03/28/2024    [provider]  promethazine -dextromethorphan (PROMETHAZINE -DM) 6.25-15 MG/5ML syrup Take 5 mLs by mouth 4 (four) times daily as needed for cough. 03/28/24   Gray, Sarah E, NP  valACYclovir (VALTREX) 500 MG tablet     [provider]      Allergies    Patient has no known allergies.    Review of Systems   Review of Systems  Physical Exam Updated Vital Signs BP (!) 161/98   Pulse 78   Temp 98.5 F (36.9 C) (Oral)   Resp 20   SpO2 98%  Physical Exam Vitals and nursing note reviewed.  Constitutional:      General: She is not in acute distress.    Appearance: She is well-developed. She is not ill-appearing.  HENT:     Head: Normocephalic and atraumatic.     Mouth/Throat:     Mouth: Mucous membranes are moist.  Eyes:     Extraocular Movements: Extraocular movements intact.     Conjunctiva/sclera: Conjunctivae normal.     Pupils: Pupils are equal, round, and reactive to light.  Cardiovascular:     Rate and Rhythm: Normal rate and regular rhythm.     Pulses: Normal pulses.     Heart sounds: Normal heart sounds. No murmur heard. Pulmonary:     Effort: Pulmonary effort is normal. No respiratory distress.     Breath sounds: Normal breath sounds. No decreased breath sounds.  Abdominal:  Palpations: Abdomen is soft.     Tenderness: There is no abdominal tenderness.  Musculoskeletal:        General: No swelling.     Cervical back: Normal range of motion and neck supple.     Comments: She got some edema of the both legs and feet but it is not pitting in nature  Skin:    General: Skin is warm and dry.     Capillary Refill: Capillary refill takes less than 2 seconds.  Neurological:     Mental Status: She is alert.  Psychiatric:        Mood and Affect: Mood normal.     ED Results / Procedures / Treatments   Labs (all labs ordered are listed, but only abnormal results are displayed) Labs Reviewed  CBC WITH DIFFERENTIAL/PLATELET - Abnormal; Notable for the  following components:      Result Value   WBC 2.9 (*)    HCT 35.8 (*)    Neutro Abs 1.2 (*)    All other components within normal limits  COMPREHENSIVE METABOLIC PANEL WITH GFR - Abnormal; Notable for the following components:   Potassium 3.2 (*)    Creatinine, Ser 1.20 (*)    Total Protein 6.2 (*)    GFR, Estimated 51 (*)    All other components within normal limits  D-DIMER, QUANTITATIVE - Abnormal; Notable for the following components:   D-Dimer, Quant 0.80 (*)    All other components within normal limits  PRO BRAIN NATRIURETIC PEPTIDE  TROPONIN T, HIGH SENSITIVITY    EKG EKG Interpretation Date/Time:  Saturday April 19 2024 10:49:04 EDT Ventricular Rate:  78 PR Interval:  150 QRS Duration:  92 QT Interval:  381 QTC Calculation: 434 R Axis:   3  Text Interpretation: Sinus rhythm Confirmed by Lowery Rue [phone]) on 04/19/2024 10:51:25 AM  Radiology CT Angio Chest PE W and/or Wo Contrast Result Date: 04/19/2024 CLINICAL DATA:  Evaluate for pulmonary embolism.  High probability. EXAM: CT ANGIOGRAPHY CHEST WITH CONTRAST TECHNIQUE: Multidetector CT imaging of the chest was performed using the standard protocol during bolus administration of intravenous contrast. Multiplanar CT image reconstructions and MIPs were obtained to evaluate the vascular anatomy. RADIATION DOSE REDUCTION: This exam was performed according to the departmental dose-optimization program which includes automated exposure control, adjustment of the mA and/or kV according to patient size and/or use of iterative reconstruction technique. CONTRAST:  75mL OMNIPAQUE IOHEXOL 350 MG/ML SOLN COMPARISON:  Coronary artery calcium score CT 06/22/2023 FINDINGS: Cardiovascular: Satisfactory opacification of the pulmonary arteries to the segmental level. No evidence of pulmonary embolism. Heart size is upper limits of normal. Aortic atherosclerosis. No pericardial effusion. Mediastinum/Nodes: Thyroid  gland, trachea and esophagus  are unremarkable. No enlarged mediastinal or hilar lymph nodes. Lungs/Pleura: The central airways are patent. There is mild peripheral ground-glass attenuation identified within the lower lobes. Several scattered areas of air trapping noted within the lungs. Mild diffuse bronchial wall thickening. No pleural effusion, airspace consolidation or pneumothorax. Upper Abdomen: No acute abnormality. Musculoskeletal: No chest wall abnormality. No acute or significant osseous findings. Review of the MIP images confirms the above findings. IMPRESSION: 1. No evidence for acute pulmonary embolus. 2. Bronchial wall thickening with several scattered areas of air trapping noted within the lungs. Findings are nonspecific but can be seen in the setting of small airways disease. 3.  Aortic Atherosclerosis (ICD10-I70.0). Electronically Signed   By: Kimberley Penman M.D.   On: 04/19/2024 12:50   DG Chest Portable 1 View  Result Date: 04/19/2024 CLINICAL DATA:  Chest pain and shortness of breath. EXAM: PORTABLE CHEST 1 VIEW COMPARISON:  Chest radiograph dated 06/14/2023 FINDINGS: No focal consolidation, pleural effusion, pneumothorax. Borderline cardiomegaly. No acute osseous pathology. IMPRESSION: 1. No active disease. 2. Borderline cardiomegaly. Electronically Signed   By: Angus Bark M.D.   On: 04/19/2024 11:04    Procedures Procedures    Medications Ordered in ED Medications  iohexol (OMNIPAQUE) 350 MG/ML injection 75 mL (75 mLs Intravenous Contrast Given 04/19/24 1230)    ED Course/ Medical Decision Making/ A&P                                 Medical Decision Making Amount and/or Complexity of Data Reviewed Labs: ordered. Radiology: ordered.  Risk Prescription drug management.   Erion Hermans is here shortness of breath leg swelling.  Normal vitals.  No fever.  Does not appear to be obvious pitting edema in her legs.  She has somewhat clear breath sounds on exam.  Overall well-appearing.  No recent  surgery travel.  Does not have any obvious PE risk factors.  Differential diagnosis could be heart failure less likely ACS.  Looking at all labs looks like she does have some mild CKD could be progression of that could be liver failure could be infectious process.  Will check CBC CMP BNP troponin chest x-ray.  EKG shows sinus rhythm.  No ischemic changes.  She is not having any specific chest pain currently but she has been having some discomfort over the right side of her chest and arm at times.  History of hypertension no other major medical problems.  She used to be on diuretics not on it anymore.  D-dimer was elevated got a CT scan of the chest that showed no evidence of PE.  Maybe some small airway disease.  Will start Z-Pak for maybe some bronchitis inflammation.  But troponin was normal.  Creatinine at baseline.  BNP normal.  Liver function normal.  Per my review and interpretation of labs and images I do not think there is any emergent process at this time.  Will prescribe her antibiotics have her follow-up with her primary care doctor.  Recommend compression stockings for her legs elevate the legs when she sleep.  Discharged in good condition.  Understands return precautions.  This chart was dictated using voice recognition software.  Despite best efforts to proofread,  errors can occur which can change the documentation meaning.         Final Clinical Impression(s) / ED Diagnoses Final diagnoses:  SOB (shortness of breath)  Peripheral edema    Rx / DC Orders ED Discharge Orders          Ordered    azithromycin (ZITHROMAX) 250 MG tablet  Daily        04/19/24 1306              Lowery Rue, DO 04/19/24 1307

## 2024-04-19 NOTE — ED Triage Notes (Addendum)
 She reports vague symptoms for a bout a week which include shortness of breath without fever, and a general "not feeling well", plus some non-traumatic right shoulder and arm pain since yesterday. She also c/o "swelling of both of my legs and feet". She tells me she was formerly on a diuretic "bu my doctor took me off it" [sic].

## 2024-09-30 DIAGNOSIS — Z1151 Encounter for screening for human papillomavirus (HPV): Secondary | ICD-10-CM | POA: Diagnosis not present

## 2024-09-30 DIAGNOSIS — Z6841 Body Mass Index (BMI) 40.0 and over, adult: Secondary | ICD-10-CM | POA: Diagnosis not present

## 2024-09-30 DIAGNOSIS — Z124 Encounter for screening for malignant neoplasm of cervix: Secondary | ICD-10-CM | POA: Diagnosis not present

## 2024-09-30 DIAGNOSIS — Z01419 Encounter for gynecological examination (general) (routine) without abnormal findings: Secondary | ICD-10-CM | POA: Diagnosis not present

## 2024-12-15 ENCOUNTER — Encounter: Payer: Self-pay | Admitting: Gastroenterology
